# Patient Record
Sex: Male | Born: 1985 | Race: White | Hispanic: No | Marital: Married | Smoking: Never smoker
Health system: Southern US, Community
[De-identification: ages and names within clinical notes are randomized; demographics above are authoritative.]

## PROBLEM LIST (undated history)

## (undated) DIAGNOSIS — K219 Gastro-esophageal reflux disease without esophagitis: Secondary | ICD-10-CM

## (undated) DIAGNOSIS — G40909 Epilepsy, unspecified, not intractable, without status epilepticus: Secondary | ICD-10-CM

## (undated) HISTORY — PX: REFRACTIVE SURGERY: SHX103

## (undated) HISTORY — PX: OTHER SURGICAL HISTORY: SHX169

## (undated) HISTORY — DX: Gastro-esophageal reflux disease without esophagitis: K21.9

---

## 2012-12-19 ENCOUNTER — Emergency Department: Payer: Self-pay | Admitting: Emergency Medicine

## 2015-10-03 ENCOUNTER — Emergency Department
Admission: EM | Admit: 2015-10-03 | Discharge: 2015-10-04 | Disposition: A | Discharge status: Home / Self Care | Payer: Self-pay | Attending: Emergency Medicine | Admitting: Emergency Medicine

## 2015-10-03 ENCOUNTER — Encounter: Payer: Self-pay | Admitting: Emergency Medicine

## 2015-10-03 ENCOUNTER — Emergency Department: Payer: Self-pay

## 2015-10-03 DIAGNOSIS — R1030 Lower abdominal pain, unspecified: Secondary | ICD-10-CM

## 2015-10-03 DIAGNOSIS — N451 Epididymitis: Secondary | ICD-10-CM | POA: Insufficient documentation

## 2015-10-03 DIAGNOSIS — R1031 Right lower quadrant pain: Secondary | ICD-10-CM

## 2015-10-03 DIAGNOSIS — R110 Nausea: Secondary | ICD-10-CM | POA: Insufficient documentation

## 2015-10-03 NOTE — ED Notes (Signed)
Pt presents to ED with right sided groin pain that radiates into his testicle. Onset of groin pain was Friday. Yesterday seen at [facility] for the same and blood work and urine were negative per pt. No ultrasound performed at that time. Pt states today pain is now radiating into his right testicle. Pt reports pain makes it difficult for him to ambulate. Pt denies seeing any type of swelling to bulge to groin.

## 2015-10-04 ENCOUNTER — Emergency Department: Payer: Self-pay

## 2015-10-04 ENCOUNTER — Encounter: Payer: Self-pay | Admitting: Radiology

## 2015-10-04 LAB — CBC
HCT: 40.9 % (ref 40.0–52.0)
HEMOGLOBIN: 13.8 g/dL (ref 13.0–18.0)
MCH: 28.6 pg (ref 26.0–34.0)
MCHC: 33.7 g/dL (ref 32.0–36.0)
MCV: 85 fL (ref 80.0–100.0)
Platelets: 234 10*3/uL (ref 150–440)
RBC: 4.81 MIL/uL (ref 4.40–5.90)
RDW: 14.1 % (ref 11.5–14.5)
WBC: 11.7 10*3/uL — ABNORMAL HIGH (ref 3.8–10.6)

## 2015-10-04 LAB — COMPREHENSIVE METABOLIC PANEL
ALK PHOS: 75 U/L (ref 38–126)
ALT: 28 U/L (ref 17–63)
AST: 33 U/L (ref 15–41)
Albumin: 4.4 g/dL (ref 3.5–5.0)
Anion gap: 7 (ref 5–15)
BILIRUBIN TOTAL: 0.8 mg/dL (ref 0.3–1.2)
BUN: 16 mg/dL (ref 6–20)
CALCIUM: 8.9 mg/dL (ref 8.9–10.3)
CO2: 23 mmol/L (ref 22–32)
CREATININE: 0.8 mg/dL (ref 0.61–1.24)
Chloride: 108 mmol/L (ref 101–111)
Glucose, Bld: 110 mg/dL — ABNORMAL HIGH (ref 65–99)
Potassium: 3.4 mmol/L — ABNORMAL LOW (ref 3.5–5.1)
Sodium: 138 mmol/L (ref 135–145)
Total Protein: 7.7 g/dL (ref 6.5–8.1)

## 2015-10-04 LAB — URINALYSIS COMPLETE WITH MICROSCOPIC (ARMC ONLY)
BILIRUBIN URINE: NEGATIVE
Bacteria, UA: NONE SEEN
Glucose, UA: NEGATIVE mg/dL
Hgb urine dipstick: NEGATIVE
KETONES UR: NEGATIVE mg/dL
Leukocytes, UA: NEGATIVE
NITRITE: NEGATIVE
PROTEIN: NEGATIVE mg/dL
Specific Gravity, Urine: 1.03 (ref 1.005–1.030)
Squamous Epithelial / LPF: NONE SEEN
pH: 6 (ref 5.0–8.0)

## 2015-10-04 MED ORDER — CIPROFLOXACIN HCL 500 MG PO TABS
500.0000 mg | ORAL_TABLET | Freq: Once | ORAL | Status: AC
  Administered 2015-10-04: 500 mg via ORAL
  Filled 2015-10-04: qty 1

## 2015-10-04 MED ORDER — MORPHINE SULFATE (PF) 4 MG/ML IV SOLN
4.0000 mg | Freq: Once | INTRAVENOUS | Status: AC
  Administered 2015-10-04: 4 mg via INTRAVENOUS
  Filled 2015-10-04: qty 1

## 2015-10-04 MED ORDER — ONDANSETRON HCL 4 MG/2ML IJ SOLN
4.0000 mg | Freq: Once | INTRAMUSCULAR | Status: AC
  Administered 2015-10-04: 4 mg via INTRAVENOUS
  Filled 2015-10-04: qty 2

## 2015-10-04 MED ORDER — IOHEXOL 350 MG/ML SOLN
100.0000 mL | Freq: Once | INTRAVENOUS | Status: AC | PRN
  Administered 2015-10-04: 80 mL via INTRAVENOUS

## 2015-10-04 MED ORDER — KETOROLAC TROMETHAMINE 30 MG/ML IJ SOLN: 30.0000 mg | Freq: Once | INTRAMUSCULAR | Status: DC

## 2015-10-04 MED ORDER — KETOROLAC TROMETHAMINE 60 MG/2ML IM SOLN
INTRAMUSCULAR | Status: AC
  Administered 2015-10-04: 30 mg
  Filled 2015-10-04: qty 2

## 2015-10-04 MED ORDER — CEFTRIAXONE SODIUM 250 MG IJ SOLR
250.0000 mg | Freq: Once | INTRAMUSCULAR | Status: AC
  Administered 2015-10-04: 250 mg via INTRAMUSCULAR
  Filled 2015-10-04: qty 250

## 2015-10-04 MED ORDER — ETODOLAC 200 MG PO CAPS: 200.0000 mg | ORAL_CAPSULE | Freq: Three times a day (TID) | ORAL | Status: DC

## 2015-10-04 MED ORDER — DOXYCYCLINE HYCLATE 100 MG PO TABS: 100.0000 mg | ORAL_TABLET | Freq: Two times a day (BID) | ORAL | Status: DC

## 2015-10-04 MED ORDER — IOHEXOL 240 MG/ML SOLN
25.0000 mL | Freq: Once | INTRAMUSCULAR | Status: AC | PRN
  Administered 2015-10-04: 25 mL via ORAL

## 2015-10-04 MED ORDER — SODIUM CHLORIDE 0.9 % IV BOLUS (SEPSIS)
1000.0000 mL | Freq: Once | INTRAVENOUS | Status: AC
  Administered 2015-10-04: 1000 mL via INTRAVENOUS

## 2015-10-04 NOTE — ED Provider Notes (Signed)
[facility] Emergency Department Provider Note  ____________________________________________  Time seen: Approximately 00:50 AM  I have reviewed the triage vital signs and the nursing notes.   HISTORY  Chief Complaint Groin Pain and Testicle Pain    HPI Todd Roth is a 30 y.o. male who comes into the hospital today with some pain in his right groin. The patient reports that this pain started on Friday and has been getting worse. He reports that it is his right pelvis as well as his abdomen. He has chronic take Tylenol PM and ibuprofen but has not been helping. The patient has been nauseous and dry heaving. He reports that the pain is worse in his right lower quadrant and it goes into his groin. He said no fever and loss of appetite. He's never had pain like this in the past. He reports it is 8 out of 10 in intensity. The patient reports [facility] and has some urine and blood work done but everything was negative so they sent him home. The patient reports that he is unable to tolerate this pain at this time.   History reviewed. No pertinent past medical history.  There are no active problems to display for this patient.   Past Surgical History  Procedure Laterality Date  . Refractive surgery    . Eye surgery      Current Outpatient Rx  Name  Route  Sig  Dispense  Refill  . doxycycline (VIBRA-TABS) 100 MG tablet   Oral   Take 1 tablet (100 mg total) by mouth 2 (two) times daily.   14 tablet   0   . etodolac (LODINE) 200 MG capsule   Oral   Take 1 capsule (200 mg total) by mouth every 8 (eight) hours.   12 capsule   0     Allergies Metronidazole  No family history on file.  Social History Social History  Substance Use Topics  . Smoking status: Never Smoker   . Smokeless tobacco: Never Used  . Alcohol Use: No    Review of Systems Constitutional: No fever/chills Eyes: No visual changes. ENT: No sore  throat. Cardiovascular: Denies chest pain. Respiratory: Denies shortness of breath. Gastrointestinal:  abdominal pain. nausea, no vomiting.  No diarrhea.  No constipation. Genitourinary: Right testicle pain Musculoskeletal: Negative for back pain. Skin: Negative for rash. Neurological: Negative for headaches, focal weakness or numbness.  10-point ROS otherwise negative.  ____________________________________________   PHYSICAL EXAM:  VITAL SIGNS: ED Triage Vitals  Enc Vitals Group     BP 10/03/15 2241 145/102 mmHg     Pulse Rate 10/03/15 2241 85     Resp 10/03/15 2241 24     Temp 10/03/15 2241 98.1 F (36.7 C)     Temp Source 10/03/15 2241 Oral     SpO2 10/03/15 2241 98 %     Weight 10/03/15 2241 203 lb (92.08 kg)     Height 10/03/15 2241 5\' 7"  (1.702 m)     Head Cir --      Peak Flow --      Pain Score 10/03/15 2242 8     Pain Loc --      Pain Edu? --      Excl. in GC? --     Constitutional: Alert and oriented. Well appearing and in moderate distress. Eyes: Conjunctivae are normal. PERRL. EOMI. Head: Atraumatic. Nose: No congestion/rhinnorhea. Mouth/Throat: Mucous membranes are moist.  Oropharynx non-erythematous. Cardiovascular: Normal rate,  regular rhythm. Grossly normal heart sounds.  Good peripheral circulation. Respiratory: Normal respiratory effort.  No retractions. Lungs CTAB. Gastrointestinal: Soft right lower quadrant tenderness to palpation. No distention. Positive bowel sounds Genitourinary: Right testicular tenderness to palpation with no swelling or erythema Musculoskeletal: No lower extremity tenderness nor edema.   Neurologic:  Normal speech and language.  Skin:  Skin is warm, dry and intact.  Psychiatric: Mood and affect are normal.   ____________________________________________   LABS (all labs ordered are listed, but only abnormal results are displayed)  Labs Reviewed  CBC - Abnormal; Notable for the following:    WBC 11.7 (*)    All other  components within normal limits  COMPREHENSIVE METABOLIC PANEL - Abnormal; Notable for the following:    Potassium 3.4 (*)    Glucose, Bld 110 (*)    All other components within normal limits  URINALYSIS COMPLETEWITH MICROSCOPIC ([facility] ONLY) - Abnormal; Notable for the following:    Color, Urine YELLOW (*)    APPearance CLEAR (*)    All other components within normal limits   ____________________________________________  EKG  None ____________________________________________  RADIOLOGY  Scrotal ultrasound: Small right hydrocele, otherwise normal scrotal ultrasound  CT abdomen and pelvis: No acute or malady in the abdomen pelvis, particularly normal appendix ____________________________________________   PROCEDURES  Procedure(s) performed: None  Critical Care performed: No  ____________________________________________   INITIAL IMPRESSION / ASSESSMENT AND PLAN / ED COURSE  Pertinent labs & imaging results that were available during my care of the patient were reviewed by me and considered in my medical decision making (see chart for details).  This is a 30 year old male who comes into the hospital today with some right abdominal and right groin pain. The patient's CT ultrasound are both unremarkable. At this point I feel it is appropriate to treat the patient for epididymitis. He did receive 2 doses of morphine as well as some Zofran. I did give the patient a dose of ceftriaxone and ciprofloxacin but we'll change his discharge medication to doxycycline. The patient did receive a dose of Toradol as well and his pain is improved. He'll be discharged home to follow-up with the acute care clinic for his groin pain. ____________________________________________   FINAL CLINICAL IMPRESSION(S) / ED DIAGNOSES  Final diagnoses:  Groin pain, right  Epididymitis      Rebecka Apley, MD 10/04/15 [phone]

## 2015-10-04 NOTE — Discharge Instructions (Signed)
Epididymitis °Epididymitis is swelling (inflammation) of the epididymis. The epididymis is a cord-like structure that is located along the top and back part of the testicle. It collects and stores sperm from the testicle. °This condition can also cause pain and swelling of the testicle and scrotum. Symptoms usually start suddenly (acute epididymitis). Sometimes epididymitis starts gradually and lasts for a while (chronic epididymitis). This type may be harder to treat. °CAUSES °In men 35 and younger, this condition is usually caused by a bacterial infection or sexually transmitted disease (STD), such as: °· Gonorrhea. °· Chlamydia.   °In men 35 and older who do not have anal sex, this condition is usually caused by bacteria from a blockage or abnormalities in the urinary system. These can result from: °· Having a tube placed into the bladder (urinary catheter). °· Having an enlarged or inflamed prostate gland. °· Having recent urinary tract surgery. °In men who have a condition that weakens the body's defense system (immune system), such as HIV, this condition can be caused by:  °· Other bacteria, including tuberculosis and syphilis. °· Viruses. °· Fungi. °Sometimes this condition occurs without infection. That may happen if urine flows backward into the epididymis after heavy lifting or straining. °RISK FACTORS °This condition is more likely to develop in men: °· Who have unprotected sex with more than one partner. °· Who have anal sex.   °· Who have recently had surgery.   °· Who have a urinary catheter. °· Who have urinary problems. °· Who have a suppressed immune system.   °SYMPTOMS  °This condition usually begins suddenly with chills, fever, and pain behind the scrotum and in the testicle. Other symptoms include:  °· Swelling of the scrotum, testicle, or both. °· Pain when ejaculating or urinating. °· Pain in the back or belly. °· Nausea. °· Itching and discharge from the penis. °· Frequent need to pass  urine. °· Redness and tenderness of the scrotum. °DIAGNOSIS °Your health care provider can diagnose this condition based on your symptoms and medical history. Your health care provider will also do a physical exam to ask about your symptoms and check your scrotum and testicle for swelling, pain, and redness. You may also have other tests, including:   °· Examination of discharge from the penis. °· Urine tests for infections, such as STDs.   °Your health care provider may test you for other STDs, including HIV.  °TREATMENT °Treatment for this condition depends on the cause. If your condition is caused by a bacterial infection, oral antibiotic medicine may be prescribed. If the bacterial infection has spread to your blood, you may need to receive IV antibiotics. Nonbacterial epididymitis is treated with home care that includes bed rest and elevation of the scrotum. °Surgery may be needed to treat: °· Bacterial epididymitis that causes pus to build up in the scrotum (abscess). °· Chronic epididymitis that has not responded to other treatments. °HOME CARE INSTRUCTIONS °Medicines  °· Take over-the-counter and prescription medicines only as told by your health care provider.   °· If you were prescribed an antibiotic medicine, take it as told by your health care provider. Do not stop taking the antibiotic even if your condition improves. °Sexual Activity  °· If your epididymitis was caused by an STD, avoid sexual activity until your treatment is complete. °· Inform your sexual partner or partners if you test positive for an STD. They may need to be treated. Do not engage in sexual activity with your partner or partners until their treatment is completed. °General Instructions  °· Return to your normal activities as told   by your health care provider. Ask your health care provider what activities are safe for you.  Keep your scrotum elevated and supported while resting. Ask your health care provider if you should wear a  scrotal support, such as a jockstrap. Wear it as told by your health care provider.  If directed, apply ice to the affected area:   Put ice in a plastic bag.  Place a towel between your skin and the bag.  Leave the ice on for 20 minutes, 2-3 times per day.  Try taking a sitz bath to help with discomfort. This is a warm water bath that is taken while you are sitting down. The water should only come up to your hips and should cover your buttocks. Do this 3-4 times per day or as told by your health care provider.  Keep all follow-up visits as told by your health care provider. This is important. SEEK MEDICAL CARE IF:   You have a fever.   Your pain medicine is not helping.   Your pain is getting worse.   Your symptoms do not improve within three days.   This information is not intended to replace advice given to you by your health care provider. Make sure you discuss any questions you have with your health care provider.   Document Released: 07/13/2000 Document Revised: 04/06/2015 Document Reviewed: 12/01/2014 Elsevier Interactive Patient Education 2016 [employer] Inc[employer]  Abdominal Pain, Adult Many things can cause abdominal pain. Usually, abdominal pain is not caused by a disease and will improve without treatment. It can often be observed and treated at home. Your health care provider will do a physical exam and possibly order blood tests and X-rays to help determine the seriousness of your pain. However, in many cases, more time must pass before a clear cause of the pain can be found. Before that point, your health care provider may not know if you need more testing or further treatment. HOME CARE INSTRUCTIONS Monitor your abdominal pain for any changes. The following actions may help to alleviate any discomfort you are experiencing:  Only take over-the-counter or prescription medicines as directed by your health care provider.  Do not take laxatives unless directed to do so by  your health care provider.  Try a clear liquid diet (broth, tea, or water) as directed by your health care provider. Slowly move to a bland diet as tolerated. SEEK MEDICAL CARE IF:  You have unexplained abdominal pain.  You have abdominal pain associated with nausea or diarrhea.  You have pain when you urinate or have a bowel movement.  You experience abdominal pain that wakes you in the night.  You have abdominal pain that is worsened or improved by eating food.  You have abdominal pain that is worsened with eating fatty foods.  You have a fever. SEEK IMMEDIATE MEDICAL CARE IF:  Your pain does not go away within 2 hours.  You keep throwing up (vomiting).  Your pain is felt only in portions of the abdomen, such as the right side or the left lower portion of the abdomen.  You pass bloody or black tarry stools. MAKE SURE YOU:  Understand these instructions.  Will watch your condition.  Will get help right away if you are not doing well or get worse.   This information is not intended to replace advice given to you by your health care provider. Make sure you discuss any questions you have with your health care provider.   Document Released: 04/25/2005  Document Revised: 04/06/2015 Document Reviewed: 03/25/2013 Elsevier Interactive Patient Education [employer]

## 2016-08-27 ENCOUNTER — Ambulatory Visit
Admission: EM | Admit: 2016-08-27 | Discharge: 2016-08-27 | Discharge status: Absent without leave | Payer: Medicaid Other

## 2016-10-08 IMAGING — CT CT ABD-PELV W/ CM
1 of 2 series · 15 of 32 positions shown, 19 images · IV contrast (omnipaque)
Comparison: None.

CLINICAL DATA: Right lower quadrant/groin pain for 3 days.

EXAM:
CT ABDOMEN AND PELVIS WITH CONTRAST
TECHNIQUE: Multidetector CT imaging of the abdomen and pelvis was performed
using the standard protocol following bolus administration of
intravenous contrast.
CONTRAST:  25mL OMNIPAQUE IOHEXOL 240 MG/ML SOLN, 80mL OMNIPAQUE
IOHEXOL 350 MG/ML SOLN

[Series 2: routine abd pel with · axial · 0.71mm/px · z∈[-492,-28]mm · 15 of 103 slices shown, 19 images]
[im 5/103  soft-tissue]
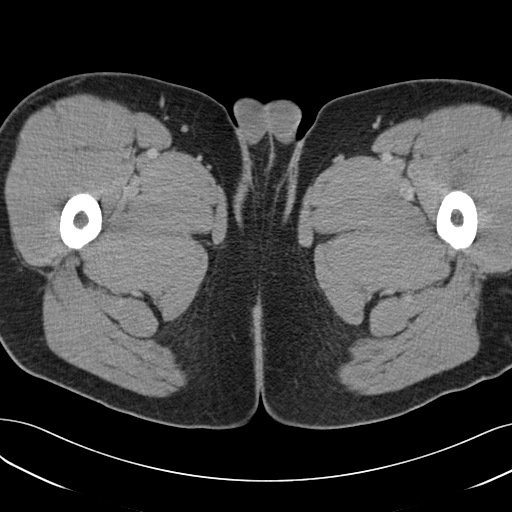
[im 5/103  bone]
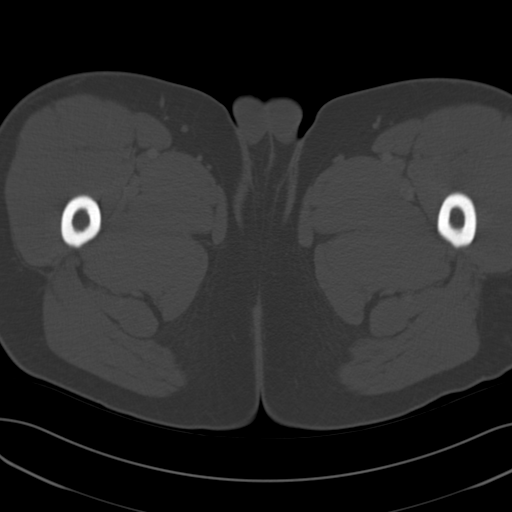
[im 14/103  soft-tissue]
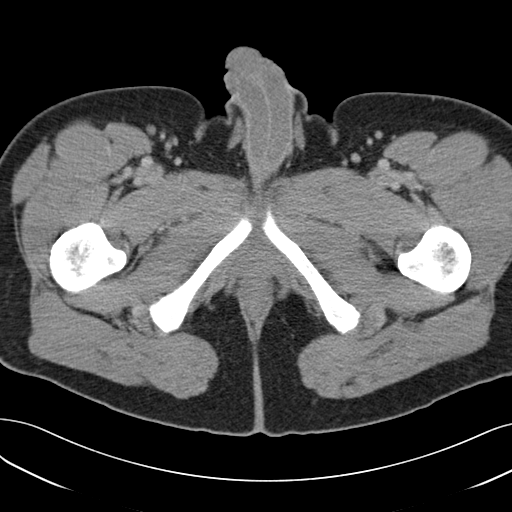
[im 23/103  soft-tissue]
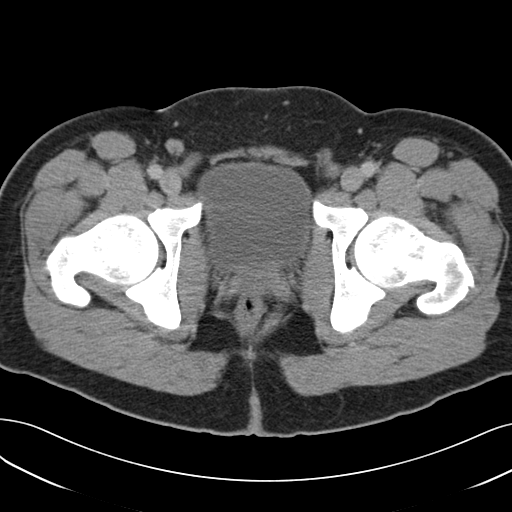
[im 27/103  soft-tissue]
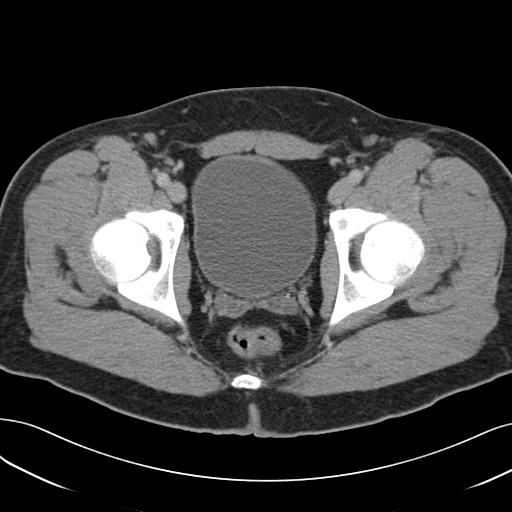
[im 36/103  soft-tissue]
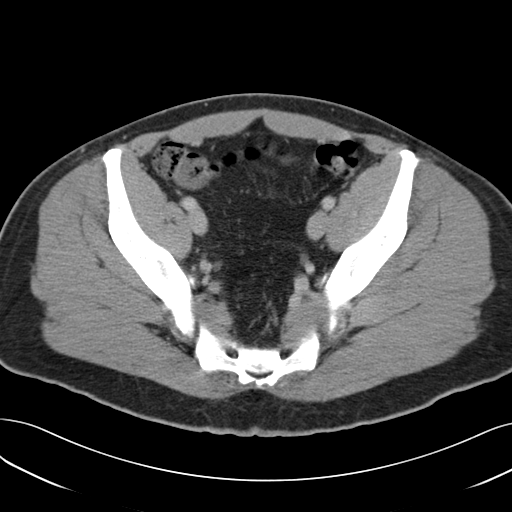
[im 45/103  soft-tissue]
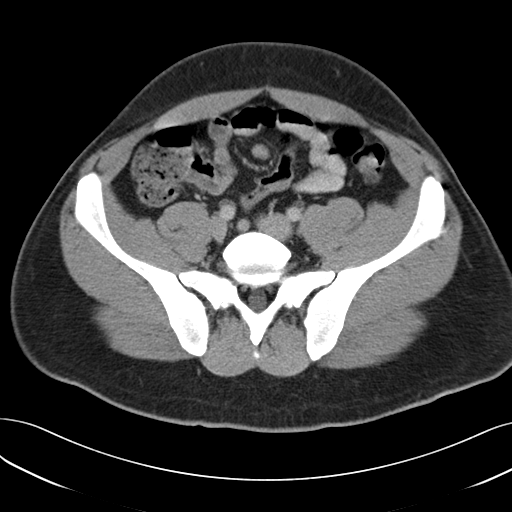
[im 54/103  soft-tissue]
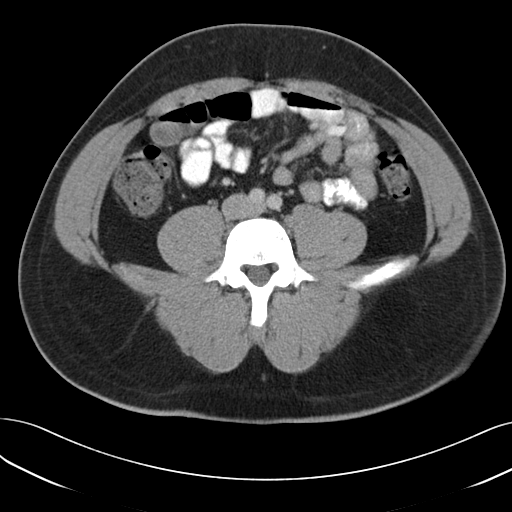
[im 58/103  soft-tissue]
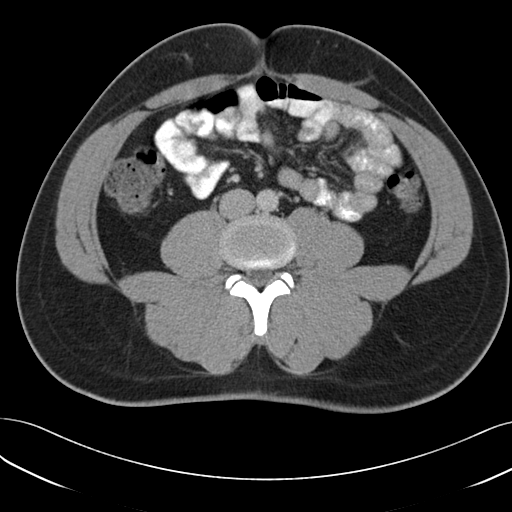
[im 67/103  soft-tissue]
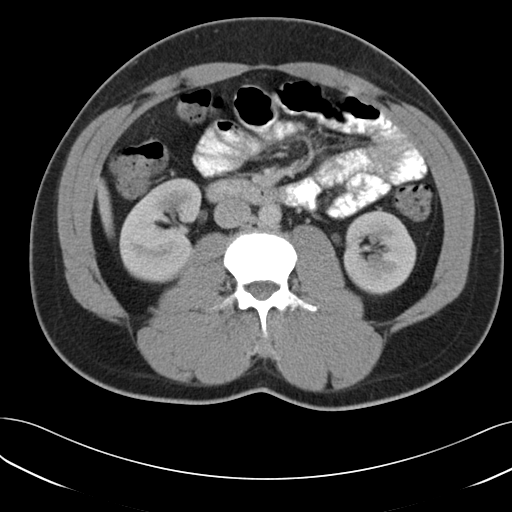
[im 67/103  bone]
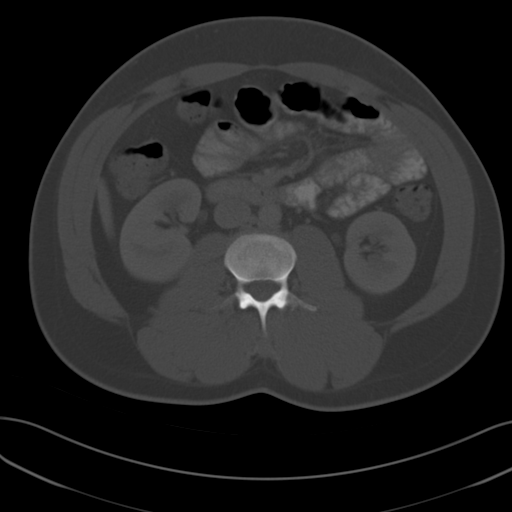
[im 76/103  soft-tissue]
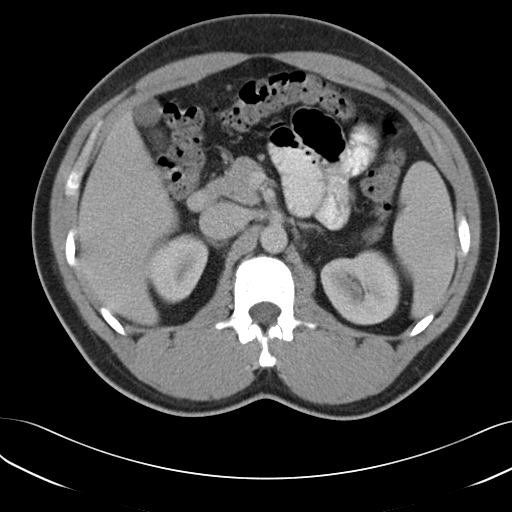
[im 80/103  soft-tissue]
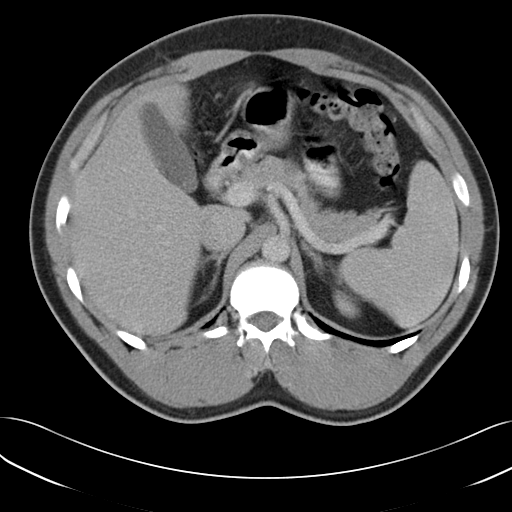
[im 85/103  lung]
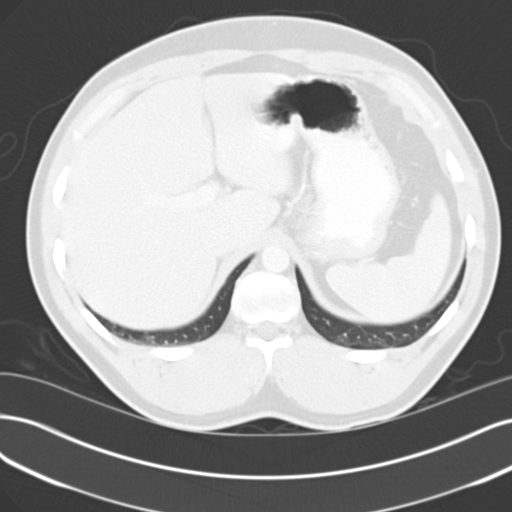
[im 89/103  soft-tissue]
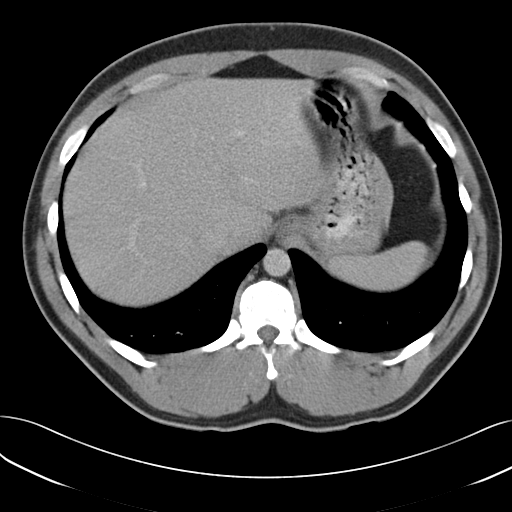
[im 89/103  lung]
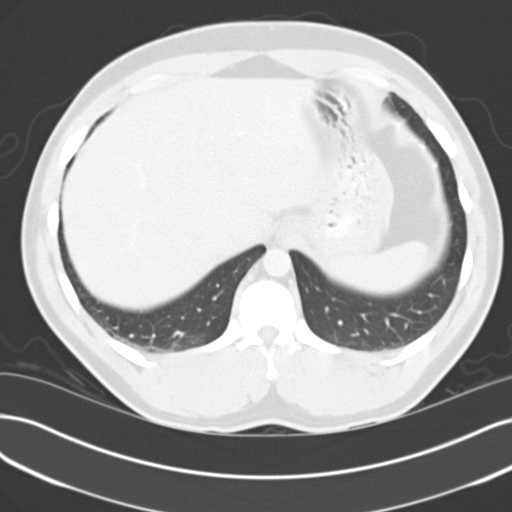
[im 94/103  lung]
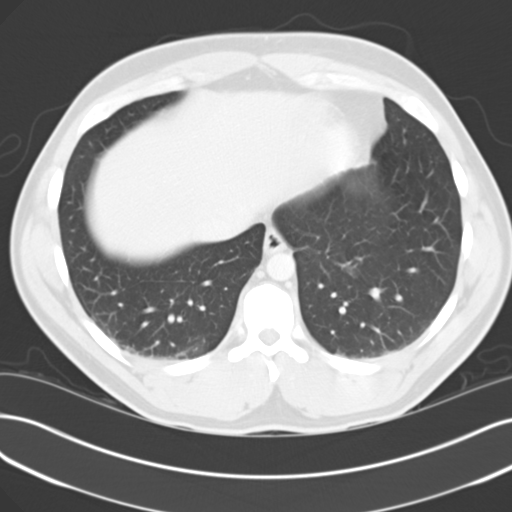
[im 98/103  soft-tissue]
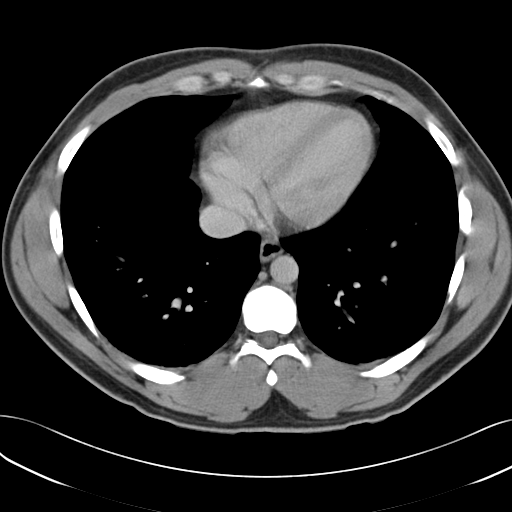
[im 98/103  lung]
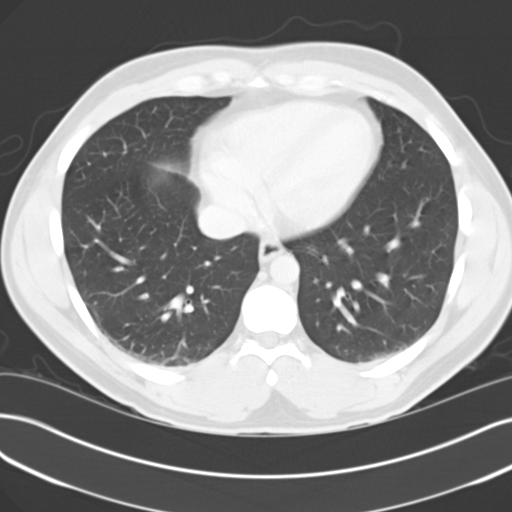

[15 of 32 positions shown; findings below may reference images not displayed]

FINDINGS: Lower chest:  The included lung bases are clear.

Liver: No focal lesion.

Hepatobiliary: Gallbladder physiologically distended, no calcified
stone. No biliary dilatation.

Pancreas: No ductal dilatation or inflammation.

Spleen: Normal.

Adrenal glands: No nodule.

Kidneys: Symmetric renal enhancement. No hydronephrosis. No
perinephric stranding or focal abnormality.

Stomach/Bowel: Stomach physiologically distended. There are no
dilated or thickened small bowel loops. Moderate volume of stool
throughout the colon without colonic wall thickening. The appendix
is normal, coursing in the central pelvis.

Vascular/Lymphatic: No retroperitoneal adenopathy. Abdominal aorta
is normal in caliber.

Reproductive: Prostate gland normal in size.

Bladder: Physiologically distended, no wall thickening or stone.

Other: No free air, free fluid, or intra-abdominal fluid collection.
No inguinal hernia.

Musculoskeletal: There are no acute or suspicious osseous
abnormalities.
IMPRESSION: No acute abnormality in the abdomen/pelvis. Particularly, normal
appendix.

## 2022-08-22 ENCOUNTER — Encounter (HOSPITAL_BASED_OUTPATIENT_CLINIC_OR_DEPARTMENT_OTHER): Payer: Self-pay

## 2022-08-22 ENCOUNTER — Emergency Department (HOSPITAL_BASED_OUTPATIENT_CLINIC_OR_DEPARTMENT_OTHER)
Admission: EM | Admit: 2022-08-22 | Discharge: 2022-08-22 | Disposition: A | Discharge status: Home / Self Care | Payer: Non-veteran care | Attending: Emergency Medicine | Admitting: Emergency Medicine

## 2022-08-22 ENCOUNTER — Other Ambulatory Visit: Payer: Self-pay

## 2022-08-22 DIAGNOSIS — M545 Low back pain, unspecified: Secondary | ICD-10-CM

## 2022-08-22 DIAGNOSIS — G8911 Acute pain due to trauma: Secondary | ICD-10-CM | POA: Insufficient documentation

## 2022-08-22 DIAGNOSIS — M62830 Muscle spasm of back: Secondary | ICD-10-CM | POA: Insufficient documentation

## 2022-08-22 DIAGNOSIS — R1032 Left lower quadrant pain: Secondary | ICD-10-CM | POA: Diagnosis not present

## 2022-08-22 DIAGNOSIS — Y92410 Unspecified street and highway as the place of occurrence of the external cause: Secondary | ICD-10-CM | POA: Diagnosis not present

## 2022-08-22 DIAGNOSIS — M25552 Pain in left hip: Secondary | ICD-10-CM | POA: Insufficient documentation

## 2022-08-22 MED ORDER — CYCLOBENZAPRINE HCL 10 MG PO TABS: 10.0000 mg | ORAL_TABLET | Freq: Two times a day (BID) | ORAL | 0 refills | Status: DC | PRN

## 2022-08-22 MED ORDER — MELOXICAM 7.5 MG PO TABS: 7.5000 mg | ORAL_TABLET | Freq: Every day | ORAL | 0 refills | Status: DC
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
## 2022-08-22 NOTE — ED Notes (Signed)
Pt verbalized understanding of d/c instructions, meds, and followup care. Denies questions. VSS, no distress noted. Steady gait to exit with all belongings.  ?
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
## 2022-08-22 NOTE — Discharge Instructions (Addendum)
Thank you for allowing me to be part of your care today.  I suspect your pain is musculoskeletal and you are also having muscle spasms.  I have sent over a daily NSAID and muscle relaxer to your pharmacy to help.  Continue to take your Lyrica as prescribed.   [facility]  Return to the ED if you have worsening of your symptoms, are unable to walk, lose control of your bladder or bowels or have urinary retention.
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
## 2022-11-18 ENCOUNTER — Emergency Department (HOSPITAL_COMMUNITY)
Admission: EM | Admit: 2022-11-18 | Discharge: 2022-11-18 | Disposition: A | Discharge status: Home / Self Care | Payer: No Typology Code available for payment source | Attending: Emergency Medicine | Admitting: Emergency Medicine

## 2022-11-18 ENCOUNTER — Emergency Department (HOSPITAL_COMMUNITY): Payer: No Typology Code available for payment source

## 2022-11-18 ENCOUNTER — Other Ambulatory Visit: Payer: Self-pay

## 2022-11-18 DIAGNOSIS — R4189 Other symptoms and signs involving cognitive functions and awareness: Secondary | ICD-10-CM | POA: Insufficient documentation

## 2022-11-18 DIAGNOSIS — R5383 Other fatigue: Secondary | ICD-10-CM | POA: Diagnosis present

## 2022-11-18 LAB — I-STAT CHEM 8, ED
BUN: 10 mg/dL (ref 6–20)
Calcium, Ion: 1.12 mmol/L — ABNORMAL LOW (ref 1.15–1.40)
Chloride: 104 mmol/L (ref 98–111)
Creatinine, Ser: 0.9 mg/dL (ref 0.61–1.24)
Glucose, Bld: 99 mg/dL (ref 70–99)
HCT: 38 % — ABNORMAL LOW (ref 39.0–52.0)
Hemoglobin: 12.9 g/dL — ABNORMAL LOW (ref 13.0–17.0)
Potassium: 3.6 mmol/L (ref 3.5–5.1)
Sodium: 140 mmol/L (ref 135–145)
TCO2: 25 mmol/L (ref 22–32)

## 2022-11-18 LAB — COMPREHENSIVE METABOLIC PANEL
ALT: 33 U/L (ref 0–44)
AST: 40 U/L (ref 15–41)
Albumin: 4 g/dL (ref 3.5–5.0)
Alkaline Phosphatase: 69 U/L (ref 38–126)
Anion gap: 12 (ref 5–15)
BUN: 9 mg/dL (ref 6–20)
CO2: 25 mmol/L (ref 22–32)
Calcium: 9.1 mg/dL (ref 8.9–10.3)
Chloride: 102 mmol/L (ref 98–111)
Creatinine, Ser: 1.11 mg/dL (ref 0.61–1.24)
GFR, Estimated: >60 mL/min (ref >60)
Glucose, Bld: 99 mg/dL (ref 70–99)
Potassium: 3.2 mmol/L — ABNORMAL LOW (ref 3.5–5.1)
Sodium: 139 mmol/L (ref 135–145)
Total Bilirubin: 0.8 mg/dL (ref 0.3–1.2)
Total Protein: 7.1 g/dL (ref 6.5–8.1)

## 2022-11-18 LAB — CBC WITH DIFFERENTIAL/PLATELET
Abs Immature Granulocytes: 0.01 10*3/uL (ref 0.00–0.07)
Basophils Absolute: 0 10*3/uL (ref 0.0–0.1)
Basophils Relative: 0 %
Eosinophils Absolute: 0.1 10*3/uL (ref 0.0–0.5)
Eosinophils Relative: 1 %
HCT: 37 % — ABNORMAL LOW (ref 39.0–52.0)
Hemoglobin: 12.1 g/dL — ABNORMAL LOW (ref 13.0–17.0)
Immature Granulocytes: 0 %
Lymphocytes Relative: 34 %
Lymphs Abs: 2.7 10*3/uL (ref 0.7–4.0)
MCH: 28.1 pg (ref 26.0–34.0)
MCHC: 32.7 g/dL (ref 30.0–36.0)
MCV: 86 fL (ref 80.0–100.0)
Monocytes Absolute: 0.6 10*3/uL (ref 0.1–1.0)
Monocytes Relative: 8 %
Neutro Abs: 4.5 10*3/uL (ref 1.7–7.7)
Neutrophils Relative %: 57 %
Platelets: 173 10*3/uL (ref 150–400)
RBC: 4.3 MIL/uL (ref 4.22–5.81)
RDW: 14.2 % (ref 11.5–15.5)
WBC: 7.9 10*3/uL (ref 4.0–10.5)
nRBC: 0 % (ref 0.0–0.2)

## 2022-11-18 LAB — I-STAT VENOUS BLOOD GAS, ED
Acid-Base Excess: 1 mmol/L (ref 0.0–2.0)
Bicarbonate: 24.9 mmol/L (ref 20.0–28.0)
Calcium, Ion: 1.11 mmol/L — ABNORMAL LOW (ref 1.15–1.40)
HCT: 39 % (ref 39.0–52.0)
Hemoglobin: 13.3 g/dL (ref 13.0–17.0)
O2 Saturation: 83 %
Potassium: 3.6 mmol/L (ref 3.5–5.1)
Sodium: 140 mmol/L (ref 135–145)
TCO2: 26 mmol/L (ref 22–32)
pCO2, Ven: 37.2 mmHg — ABNORMAL LOW (ref 44–60)
pH, Ven: 7.434 — ABNORMAL HIGH (ref 7.25–7.43)
pO2, Ven: 46 mmHg — ABNORMAL HIGH (ref 32–45)

## 2022-11-18 LAB — ETHANOL: Alcohol, Ethyl (B): <10 mg/dL (ref <10)

## 2022-11-18 LAB — TROPONIN I (HIGH SENSITIVITY): Troponin I (High Sensitivity): 4 ng/L (ref <18)

## 2022-11-18 LAB — SALICYLATE LEVEL: Salicylate Lvl: <7 mg/dL — ABNORMAL LOW (ref 7.0–30.0)

## 2022-11-18 LAB — ACETAMINOPHEN LEVEL: Acetaminophen (Tylenol), Serum: <10 ug/mL — ABNORMAL LOW (ref 10–30)

## 2022-11-18 MED ORDER — AMMONIA AROMATIC IN INHA
RESPIRATORY_TRACT | Status: AC
  Filled 2022-11-18: qty 10

## 2022-11-18 MED ORDER — IOHEXOL 350 MG/ML SOLN
75.0000 mL | Freq: Once | INTRAVENOUS | Status: AC | PRN
  Administered 2022-11-18: 75 mL via INTRAVENOUS

## 2022-11-18 NOTE — ED Notes (Signed)
Patient transported to MRI 

## 2022-11-18 NOTE — ED Notes (Signed)
Patient transported to CT scan . 

## 2022-11-18 NOTE — ED Provider Notes (Signed)
[facility] Emergency Department Provider Note MRN:  [id number]  Arrival date & time: 11/18/22     Chief Complaint   'Feels Tired " / Fatigue   History of Present Illness   Todd Roth is a 37 y.o. year-old male with a history of TBI presenting to the ED with chief complaint of feels tired.  Patient arrived with complaint of feeling tired.  Found in the waiting room not waking up, unresponsive.  Review of Systems  A thorough review of systems was obtained and all systems are negative except as noted in the HPI and PMH.   Patient's Health History   No past medical history on file.  Past Surgical History:  Procedure Laterality Date   eye surgery     REFRACTIVE SURGERY      No family history on file.  Social History   Socioeconomic History   Marital status: Married    Spouse name: Not on file   Number of children: Not on file   Years of education: Not on file   Highest education level: Not on file  Occupational History   Not on file  Tobacco Use   Smoking status: Never   Smokeless tobacco: Never  Substance and Sexual Activity   Alcohol use: No   Drug use: No   Sexual activity: Not on file  Other Topics Concern   Not on file  Social History Narrative   Not on file   Social Determinants of Health   Financial Resource Strain: Not on file  Food Insecurity: Not on file  Transportation Needs: Not on file  Physical Activity: Not on file  Stress: Not on file  Social Connections: Not on file  Intimate Partner Violence: Not on file     Physical Exam   Vitals:   11/18/22 0500 11/18/22 0608  BP: 113/75 118/84  Pulse: (!) 59 69  Resp: 12 18  Temp:    SpO2: 93% 100%    CONSTITUTIONAL: Well-appearing, NAD NEURO/PSYCH: Withdrawals meaningfully from noxious stimuli, moves all extremities EYES:  eyes equal and reactive, pupils dilated ENT/NECK:  no LAD, no JVD CARDIO: Regular rate, well-perfused, normal S1 and S2 PULM:  CTAB no  wheezing or rhonchi GI/GU:  non-distended, non-tender MSK/SPINE:  No gross deformities, no edema SKIN:  no rash, atraumatic   *Additional and/or pertinent findings included in MDM below  Diagnostic and Interventional Summary    EKG Interpretation  Date/Time:  Sunday November 18 2022 03:09:34 EDT Ventricular Rate:  72 PR Interval:  183 QRS Duration: 88 QT Interval:  403 QTC Calculation: 441 R Axis:   -3 Text Interpretation: Sinus rhythm Left ventricular hypertrophy Early repolarization Confirmed by Kennis Carina [phone]) on 11/18/2022 3:52:52 AM       Labs Reviewed  CBC WITH DIFFERENTIAL/PLATELET - Abnormal; Notable for the following components:      Result Value   Hemoglobin 12.1 (*)    HCT 37.0 (*)    All other components within normal limits  COMPREHENSIVE METABOLIC PANEL - Abnormal; Notable for the following components:   Potassium 3.2 (*)    All other components within normal limits  ACETAMINOPHEN LEVEL - Abnormal; Notable for the following components:   Acetaminophen (Tylenol), Serum <10 (*)    All other components within normal limits  SALICYLATE LEVEL - Abnormal; Notable for the following components:   Salicylate Lvl <7.0 (*)    All other components within normal limits  I-STAT CHEM 8, ED - Abnormal; Notable for the  following components:   Calcium, Ion 1.12 (*)    Hemoglobin 12.9 (*)    HCT 38.0 (*)    All other components within normal limits  I-STAT VENOUS BLOOD GAS, ED - Abnormal; Notable for the following components:   pH, Ven 7.434 (*)    pCO2, Ven 37.2 (*)    pO2, Ven 46 (*)    Calcium, Ion 1.11 (*)    All other components within normal limits  ETHANOL  RAPID URINE DRUG SCREEN, HOSP PERFORMED  TROPONIN I (HIGH SENSITIVITY)    MR BRAIN WO CONTRAST  Final Result    CT ANGIO HEAD NECK W WO CM  Final Result    CT HEAD WO CONTRAST ( )  Final Result      Medications  ammonia inhalant (has no administration in time range)  iohexol (OMNIPAQUE) 350  MG/ML injection 75 mL (75 mLs Intravenous Contrast Given 11/18/22 0530)     Procedures  /  Critical Care Procedures  ED Course and Medical Decision Making  Initial Impression and Ddx Patient does not seem to be truly unresponsive.  When I attempt to open his eyelids there is resistance.  When I hold them open and perform a brisk movement toward his eyes he has blink to threat.  He withdrawals swiftly from pain in all extremities.  He vigorously moves away from ammonia at the nostrils.  Despite all this he will not wake up and talk to me.  Per wife he is had a lot of stress recently.  Apart from some type of functional process other considerations include intracranial bleeding, intracranial mass, electrolyte disturbance, awaiting labs, CT head.  Past medical/surgical history that increases complexity of ED encounter: Schizoaffective, TBI  Interpretation of Diagnostics I personally reviewed the EKG and my interpretation is as follows: Sinus rhythm, benign early repolarization  Labs reassuring with no significant blood count or electrolyte disturbance, troponin negative  Patient Reassessment and Ultimate Disposition/Management     Case discussed with Dr. Wilford Corner of neurology, recommending further imaging to exclude basilar infarct however seems more functional.  CTA and MRI are normal as is the CT Noncon of the head.  On my repeat assessment with additional usage of ammonia patient wakes and forms some words.  No emergent process, appropriate for discharge.  Patient management required discussion with the following services or consulting groups:  Neurology  Complexity of Problems Addressed Acute illness or injury that poses threat of life of bodily function  Additional Data Reviewed and Analyzed Further history obtained from: Further history from spouse/family member  Additional Factors Impacting ED Encounter Risk Consideration of hospitalization  Elmer Sow. Pilar Plate, MD [facility]  Emergency Medicine [facility] [email or web address]  Final Clinical Impressions(s) / ED Diagnoses     ICD-10-CM   1. Unresponsiveness  R41.89       ED Discharge Orders          Ordered    Ambulatory referral to Neurology       Comments: An appointment is requested in approximately: 2 weeks   11/18/22 0630             Discharge Instructions Discussed with and Provided to Patient:     Discharge Instructions      You were evaluated in the Emergency Department and after careful evaluation, we did not find any emergent condition requiring admission or further testing in the hospital.  Your exam/testing today was overall reassuring.  Recommend follow-up with neurology as well as behavioral health  for further management.  Please return to the Emergency Department if you experience any worsening of your condition.  Thank you for allowing [place] to be a part of your care.        Sabas Sous, MD 11/18/22 [phone]

## 2022-11-18 NOTE — Discharge Instructions (Signed)
You were evaluated in the Emergency Department and after careful evaluation, we did not find any emergent condition requiring admission or further testing in the hospital.  Your exam/testing today was overall reassuring.  Recommend follow-up with neurology as well as behavioral health for further management.  Please return to the Emergency Department if you experience any worsening of your condition.  Thank you for allowing [place] to be a part of your care.

## 2022-11-18 NOTE — ED Triage Notes (Signed)
Patient arrived with EMS [place] , patient reports " feels tired " / fatigue today , CBG= 93 , respirations unlabored . Somnolent at arrival .

## 2022-11-19 ENCOUNTER — Inpatient Hospital Stay (HOSPITAL_BASED_OUTPATIENT_CLINIC_OR_DEPARTMENT_OTHER)
Admission: EM | Admit: 2022-11-19 | Discharge: 2022-11-23 | DRG: 091 | Disposition: A | Discharge status: Home / Self Care | Payer: No Typology Code available for payment source | Attending: Internal Medicine | Admitting: Internal Medicine

## 2022-11-19 ENCOUNTER — Encounter (HOSPITAL_BASED_OUTPATIENT_CLINIC_OR_DEPARTMENT_OTHER): Payer: Self-pay

## 2022-11-19 ENCOUNTER — Other Ambulatory Visit: Payer: Self-pay

## 2022-11-19 ENCOUNTER — Emergency Department (HOSPITAL_BASED_OUTPATIENT_CLINIC_OR_DEPARTMENT_OTHER): Payer: No Typology Code available for payment source

## 2022-11-19 DIAGNOSIS — Z888 Allergy status to other drugs, medicaments and biological substances status: Secondary | ICD-10-CM

## 2022-11-19 DIAGNOSIS — F251 Schizoaffective disorder, depressive type: Secondary | ICD-10-CM | POA: Diagnosis present

## 2022-11-19 DIAGNOSIS — Z79899 Other long term (current) drug therapy: Secondary | ICD-10-CM

## 2022-11-19 DIAGNOSIS — G928 Other toxic encephalopathy: Secondary | ICD-10-CM | POA: Diagnosis not present

## 2022-11-19 DIAGNOSIS — R7989 Other specified abnormal findings of blood chemistry: Secondary | ICD-10-CM

## 2022-11-19 DIAGNOSIS — R400 Somnolence: Secondary | ICD-10-CM

## 2022-11-19 DIAGNOSIS — T426X5A Adverse effect of other antiepileptic and sedative-hypnotic drugs, initial encounter: Secondary | ICD-10-CM | POA: Diagnosis present

## 2022-11-19 DIAGNOSIS — R4182 Altered mental status, unspecified: Secondary | ICD-10-CM | POA: Diagnosis not present

## 2022-11-19 DIAGNOSIS — E7220 Disorder of urea cycle metabolism, unspecified: Secondary | ICD-10-CM | POA: Diagnosis present

## 2022-11-19 DIAGNOSIS — G8929 Other chronic pain: Secondary | ICD-10-CM | POA: Diagnosis present

## 2022-11-19 DIAGNOSIS — Z87820 Personal history of traumatic brain injury: Secondary | ICD-10-CM

## 2022-11-19 DIAGNOSIS — Z791 Long term (current) use of non-steroidal anti-inflammatories (NSAID): Secondary | ICD-10-CM

## 2022-11-19 DIAGNOSIS — R5381 Other malaise: Secondary | ICD-10-CM

## 2022-11-19 DIAGNOSIS — E872 Acidosis, unspecified: Secondary | ICD-10-CM | POA: Diagnosis present

## 2022-11-19 DIAGNOSIS — K219 Gastro-esophageal reflux disease without esophagitis: Secondary | ICD-10-CM | POA: Diagnosis present

## 2022-11-19 DIAGNOSIS — E669 Obesity, unspecified: Secondary | ICD-10-CM | POA: Diagnosis present

## 2022-11-19 DIAGNOSIS — Z6831 Body mass index (BMI) 31.0-31.9, adult: Secondary | ICD-10-CM

## 2022-11-19 DIAGNOSIS — G9340 Encephalopathy, unspecified: Secondary | ICD-10-CM | POA: Diagnosis present

## 2022-11-19 DIAGNOSIS — G9341 Metabolic encephalopathy: Secondary | ICD-10-CM | POA: Diagnosis present

## 2022-11-19 HISTORY — DX: Epilepsy, unspecified, not intractable, without status epilepticus: G40.909

## 2022-11-19 LAB — COMPREHENSIVE METABOLIC PANEL
ALT: 25 U/L (ref 0–44)
AST: 25 U/L (ref 15–41)
Albumin: 4.4 g/dL (ref 3.5–5.0)
Alkaline Phosphatase: 71 U/L (ref 38–126)
Anion gap: 10 (ref 5–15)
BUN: 13 mg/dL (ref 6–20)
CO2: 26 mmol/L (ref 22–32)
Calcium: 9.4 mg/dL (ref 8.9–10.3)
Chloride: 104 mmol/L (ref 98–111)
Creatinine, Ser: 1.02 mg/dL (ref 0.61–1.24)
GFR, Estimated: >60 mL/min (ref >60)
Glucose, Bld: 92 mg/dL (ref 70–99)
Potassium: 3.6 mmol/L (ref 3.5–5.1)
Sodium: 140 mmol/L (ref 135–145)
Total Bilirubin: 0.6 mg/dL (ref 0.3–1.2)
Total Protein: 7.6 g/dL (ref 6.5–8.1)

## 2022-11-19 LAB — CBC WITH DIFFERENTIAL/PLATELET
Abs Immature Granulocytes: 0.01 10*3/uL (ref 0.00–0.07)
Basophils Absolute: 0 10*3/uL (ref 0.0–0.1)
Basophils Relative: 0 %
Eosinophils Absolute: 0.1 10*3/uL (ref 0.0–0.5)
Eosinophils Relative: 2 %
HCT: 43.1 % (ref 39.0–52.0)
Hemoglobin: 14 g/dL (ref 13.0–17.0)
Immature Granulocytes: 0 %
Lymphocytes Relative: 33 %
Lymphs Abs: 2.1 10*3/uL (ref 0.7–4.0)
MCH: 28.3 pg (ref 26.0–34.0)
MCHC: 32.5 g/dL (ref 30.0–36.0)
MCV: 87.1 fL (ref 80.0–100.0)
Monocytes Absolute: 0.4 10*3/uL (ref 0.1–1.0)
Monocytes Relative: 7 %
Neutro Abs: 3.8 10*3/uL (ref 1.7–7.7)
Neutrophils Relative %: 58 %
Platelets: 191 10*3/uL (ref 150–400)
RBC: 4.95 MIL/uL (ref 4.22–5.81)
RDW: 14.6 % (ref 11.5–15.5)
WBC: 6.5 10*3/uL (ref 4.0–10.5)
nRBC: 0 % (ref 0.0–0.2)

## 2022-11-19 LAB — I-STAT VENOUS BLOOD GAS, ED
Acid-Base Excess: 1 mmol/L (ref 0.0–2.0)
Bicarbonate: 26.2 mmol/L (ref 20.0–28.0)
Calcium, Ion: 1.22 mmol/L (ref 1.15–1.40)
HCT: 38 % — ABNORMAL LOW (ref 39.0–52.0)
Hemoglobin: 12.9 g/dL — ABNORMAL LOW (ref 13.0–17.0)
O2 Saturation: 84 %
Potassium: 3.6 mmol/L (ref 3.5–5.1)
Sodium: 139 mmol/L (ref 135–145)
TCO2: 27 mmol/L (ref 22–32)
pCO2, Ven: 41.9 mmHg — ABNORMAL LOW (ref 44–60)
pH, Ven: 7.405 (ref 7.25–7.43)
pO2, Ven: 49 mmHg — ABNORMAL HIGH (ref 32–45)

## 2022-11-19 LAB — T4, FREE: Free T4: 0.75 ng/dL (ref 0.61–1.12)

## 2022-11-19 LAB — RAPID URINE DRUG SCREEN, HOSP PERFORMED
Amphetamines: NOT DETECTED
Barbiturates: NOT DETECTED
Benzodiazepines: NOT DETECTED
Cocaine: NOT DETECTED
Opiates: NOT DETECTED
Tetrahydrocannabinol: NOT DETECTED

## 2022-11-19 LAB — TROPONIN I (HIGH SENSITIVITY)
Troponin I (High Sensitivity): 4 ng/L (ref <18)
Troponin I (High Sensitivity): 4 ng/L (ref <18)

## 2022-11-19 LAB — LACTIC ACID, PLASMA
Lactic Acid, Venous: 2.1 mmol/L (ref 0.5–1.9)
Lactic Acid, Venous: 3 mmol/L (ref 0.5–1.9)

## 2022-11-19 LAB — SALICYLATE LEVEL: Salicylate Lvl: <7 mg/dL — ABNORMAL LOW (ref 7.0–30.0)

## 2022-11-19 LAB — CBG MONITORING, ED: Glucose-Capillary: 116 mg/dL — ABNORMAL HIGH (ref 70–99)

## 2022-11-19 LAB — AMMONIA: Ammonia: 38 umol/L — ABNORMAL HIGH (ref 9–35)

## 2022-11-19 LAB — TSH: TSH: 1.052 u[IU]/mL (ref 0.350–4.500)

## 2022-11-19 LAB — ETHANOL: Alcohol, Ethyl (B): <10 mg/dL (ref <10)

## 2022-11-19 LAB — ACETAMINOPHEN LEVEL: Acetaminophen (Tylenol), Serum: <10 ug/mL — ABNORMAL LOW (ref 10–30)

## 2022-11-19 MED ORDER — LACTATED RINGERS IV BOLUS
1000.0000 mL | Freq: Once | INTRAVENOUS | Status: AC
  Administered 2022-11-19: 1000 mL via INTRAVENOUS

## 2022-11-19 MED ORDER — SODIUM CHLORIDE 0.9 % IV SOLN: INTRAVENOUS | Status: DC

## 2022-11-19 MED ORDER — LACTULOSE 10 GM/15ML PO SOLN
20.0000 g | Freq: Once | ORAL | Status: AC
  Administered 2022-11-19: 20 g via ORAL
  Filled 2022-11-19: qty 30

## 2022-11-19 MED ORDER — NALOXONE HCL 0.4 MG/ML IJ SOLN
0.4000 mg | Freq: Once | INTRAMUSCULAR | Status: AC
  Administered 2022-11-19: 0.4 mg via INTRAVENOUS
  Filled 2022-11-19: qty 1

## 2022-11-19 NOTE — ED Notes (Signed)
Successful BM on bedside commode, patient able to transfer self to and from commode and perform self care cleaning after.

## 2022-11-19 NOTE — ED Triage Notes (Signed)
Patient here POV from Home.  Significant Other notes Patient has been extremely lethargic over 48 hours. Seen for Same yesterday.   Patient very Sleepy in Triage and responds to verbal Stimuli and may or may not answer questions.

## 2022-11-19 NOTE — Progress Notes (Addendum)
Hx of TBI in 2022 but no Hx of seizure disorder, came with wax-waning mentation for 48+ hours. Initially came to [facility] two nights ago, CT/CTA/MRI normal and re-assured and sent home. Family reported similar swinging mentation from fully awake able to do Ipad and computer work to confusion and sleepy and brought back to [facility] today. Very sleepy in the ED, no change with one dose of Narcan. Vital signs, CBC, BMP normal, ammonia only borderline elevated. Neurology recommends that patient come to [facility] for EEG study. Accepted to Tele obs at [facility]. ED also suspect conversion disorder but no previous Hx of it.

## 2022-11-19 NOTE — ED Provider Notes (Signed)
[place] EMERGENCY DEPARTMENT AT [facility] Provider Note   CSN: [id number] Arrival date & time: 11/19/22  1659     History {Add pertinent medical, surgical, social history, OB history to HPI:1} Chief Complaint  Patient presents with   Fatigue    Todd Roth is a 37 y.o. male.  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  Wife does report that for the last 48 hours he has been very sleepy most of the time, but at times will improve, walk around was able to eat and drink.  Earlier, he appeared to be feeling better, and went to go work on his schoolwork, and then she found him sleeping in his chair, and appeared to be very sleepy.  She is concerned that he is not coming back around to his baseline or maintaining that.  He has no history of seizure disorder.  Denies alcohol, drug use or new medications.  Has had headaches.  No nausea, vomiting,   History reviewed. No pertinent past medical history.   Home Medications Prior to Admission medications   Medication Sig Start Date End Date Taking? Authorizing Provider  cyclobenzaprine (FLEXERIL) 10 MG tablet Take 1 tablet (10 mg total) by mouth 2 (two) times daily as needed for muscle spasms. 08/22/22   Clark, Meghan R, PA-C  doxycycline (VIBRA-TABS) 100 MG tablet Take 1 tablet (100 mg total) by mouth 2 (two) times daily. 10/04/15   Rebecka Apley, MD  meloxicam (MOBIC) 7.5 MG tablet Take 1 tablet (7.5 mg total) by mouth daily. 08/22/22   Melton Alar R, PA-C      Allergies    Metronidazole    Review of Systems   Review of Systems  Physical Exam Updated Vital  Signs BP (!) 145/85 (BP Location: Left Arm)   Pulse 75   Temp 97.8 F (36.6 C) (Axillary)   Resp 18   Ht 5\' 7"  (1.702 m)   Wt 92.1 kg   SpO2 99%   BMI 31.80 kg/m  Physical Exam  ED Results / Procedures / Treatments   Labs (all labs ordered are listed, but only abnormal results are displayed) Labs Reviewed  CBG MONITORING, ED - Abnormal; Notable for the following components:      Result Value   Glucose-Capillary 116 (*)    All other components within normal limits  CBC WITH DIFFERENTIAL/PLATELET  COMPREHENSIVE METABOLIC PANEL  LACTIC ACID, PLASMA  LACTIC ACID, PLASMA  AMMONIA  RAPID URINE DRUG SCREEN, HOSP PERFORMED  ETHANOL  ACETAMINOPHEN LEVEL  SALICYLATE LEVEL    EKG None  Radiology MR BRAIN WO CONTRAST  Result Date: 11/18/2022 CLINICAL DATA:  37 year old male with altered mental status. Neurologic deficit. EXAM: MRI HEAD WITHOUT CONTRAST TECHNIQUE: Multiplanar, multiecho pulse sequences of the brain and surrounding structures were obtained without intravenous contrast. COMPARISON:  None Available. FINDINGS: Brain: No restricted diffusion to suggest acute infarction. No midline shift, mass effect, evidence of mass lesion, ventriculomegaly, extra-axial collection or acute intracranial hemorrhage. Cervicomedullary junction and pituitary are within normal limits. Wallace Cullens and white matter signal is within normal limits throughout the brain. No encephalomalacia or chronic cerebral blood products identified. Vascular: Major intracranial vascular flow voids are preserved. Skull  and upper cervical spine: Negative. Visualized bone marrow signal is within normal limits. Sinuses/Orbits: Orbit mild motion artifact, otherwise negative. Other: Mastoids are clear. Grossly normal visible internal auditory structures. Negative visible scalp and face. IMPRESSION: Normal noncontrast MRI appearance of the Brain. Electronically Signed   By: Odessa Fleming M.D.   On: 11/18/2022 06:11   CT ANGIO HEAD NECK W  WO CM  Result Date: 11/18/2022 CLINICAL DATA:  37 year old male with altered mental status. Neurologic deficit. EXAM: CT ANGIOGRAPHY HEAD AND NECK WITH AND WITHOUT CONTRAST TECHNIQUE: Multidetector CT imaging of the head and neck was performed using the standard protocol during bolus administration of intravenous contrast. Multiplanar CT image reconstructions and MIPs were obtained to evaluate the vascular anatomy. Carotid stenosis measurements (when applicable) are obtained utilizing NASCET criteria, using the distal internal carotid diameter as the denominator. RADIATION DOSE REDUCTION: This exam was performed according to the departmental dose-optimization program which includes automated exposure control, adjustment of the mA and/or kV according to patient size and/or use of iterative reconstruction technique. CONTRAST:  75mL OMNIPAQUE IOHEXOL 350 MG/ML SOLN COMPARISON:  Head CT 0339 hours today. FINDINGS: CTA NECK Skeleton: Negative aside from impacted or supernumerary right maxillary tooth. No acute osseous abnormality identified. Upper chest: Negative. Other neck: Negative. Aortic arch: Good contrast bolus.  Normal 3 vessel arch. Right carotid system: Negative aside from right ICA tortuosity distal to the bulb, just below the skull base. Left carotid system: Negative, similar left ICA tortuosity. Vertebral arteries: Proximal subclavian arteries and both cervical vertebral arteries are normal. Fairly codominant vertebral arteries. CTA HEAD Posterior circulation: Mildly dominant left vertebral V4 segment. Normal distal vertebral arteries, PICA origins, vertebrobasilar junction. Patent basilar artery without stenosis. Normal SCA and right PCA origins. Fetal type left PCA origin. Right posterior communicating artery diminutive or absent. Bilateral PCA branches are within normal limits. Anterior circulation: Both ICA siphons are patent. No siphon plaque or stenosis. Normal ophthalmic and left posterior  communicating artery origins. Patent carotid termini, normal MCA and ACA origins. Normal anterior communicating artery. Bilateral ACA branches are within normal limits. Left MCA M1 segment and bifurcation are patent without stenosis. Right MCA M1 segment and bifurcation are patent without stenosis. Bilateral MCA branches are within normal limits. Venous sinuses: Patent. Anatomic variants: None. Review of the MIP images confirms the above findings IMPRESSION: Normal CTA Head and Neck. Electronically Signed   By: Odessa Fleming M.D.   On: 11/18/2022 05:39   CT HEAD WO CONTRAST ( )  Result Date: 11/18/2022 CLINICAL DATA:  Altered mental status. EXAM: CT HEAD WITHOUT CONTRAST TECHNIQUE: Contiguous axial images were obtained from the base of the skull through the vertex without intravenous contrast. RADIATION DOSE REDUCTION: This exam was performed according to the departmental dose-optimization program which includes automated exposure control, adjustment of the mA and/or kV according to patient size and/or use of iterative reconstruction technique. COMPARISON:  None Available. FINDINGS: Brain: No evidence of acute infarction, hemorrhage, hydrocephalus, extra-axial collection or mass lesion/mass effect. Vascular: No hyperdense vessel or unexpected calcification. Skull: Normal. Negative for fracture or focal lesion. Sinuses/Orbits: No acute finding. Other: None. IMPRESSION: No acute intracranial pathology. Electronically Signed   By: Aram Candela M.D.   On: 11/18/2022 03:45    Procedures Procedures  {Document cardiac monitor, telemetry assessment procedure when appropriate:1}  Medications Ordered in ED Medications  naloxone (NARCAN) injection 0.4 mg (has no administration in time range)    ED Course/ Medical Decision Making/ A&P   {   Click  here for ABCD2, HEART and other calculatorsREFRESH Note before signing :1}                          Medical Decision Making Amount and/or Complexity of Data  Reviewed Labs: ordered.  Risk Prescription drug management.   ***  {Document critical care time when appropriate:1} {Document review of labs and clinical decision tools ie heart score, Chads2Vasc2 etc:1}  {Document your independent review of radiology images, and any outside records:1} {Document your discussion with family members, caretakers, and with consultants:1} {Document social determinants of health affecting pt's care:1} {Document your decision making why or why not admission, treatments were needed:1} Final Clinical Impression(s) / ED Diagnoses Final diagnoses:  None    Rx / DC Orders ED Discharge Orders     None

## 2022-11-19 NOTE — ED Notes (Signed)
Patient "sleeping",  "This is how he's been for last 48hr" Arrousable to hard sternal rub, then returned to "sleeping" state

## 2022-11-19 NOTE — ED Notes (Signed)
Patient on bedside commode to have BM.

## 2022-11-20 ENCOUNTER — Observation Stay (HOSPITAL_COMMUNITY): Payer: No Typology Code available for payment source

## 2022-11-20 ENCOUNTER — Encounter (HOSPITAL_COMMUNITY): Payer: Self-pay | Admitting: Internal Medicine

## 2022-11-20 DIAGNOSIS — K219 Gastro-esophageal reflux disease without esophagitis: Secondary | ICD-10-CM | POA: Diagnosis present

## 2022-11-20 DIAGNOSIS — R4182 Altered mental status, unspecified: Secondary | ICD-10-CM | POA: Diagnosis present

## 2022-11-20 DIAGNOSIS — F251 Schizoaffective disorder, depressive type: Secondary | ICD-10-CM | POA: Diagnosis present

## 2022-11-20 DIAGNOSIS — Z791 Long term (current) use of non-steroidal anti-inflammatories (NSAID): Secondary | ICD-10-CM | POA: Diagnosis not present

## 2022-11-20 DIAGNOSIS — E872 Acidosis, unspecified: Secondary | ICD-10-CM | POA: Diagnosis not present

## 2022-11-20 DIAGNOSIS — G928 Other toxic encephalopathy: Secondary | ICD-10-CM | POA: Diagnosis present

## 2022-11-20 DIAGNOSIS — E7220 Disorder of urea cycle metabolism, unspecified: Secondary | ICD-10-CM | POA: Diagnosis present

## 2022-11-20 DIAGNOSIS — E669 Obesity, unspecified: Secondary | ICD-10-CM | POA: Diagnosis present

## 2022-11-20 DIAGNOSIS — Z79899 Other long term (current) drug therapy: Secondary | ICD-10-CM | POA: Diagnosis not present

## 2022-11-20 DIAGNOSIS — Z888 Allergy status to other drugs, medicaments and biological substances status: Secondary | ICD-10-CM | POA: Diagnosis not present

## 2022-11-20 DIAGNOSIS — R569 Unspecified convulsions: Secondary | ICD-10-CM

## 2022-11-20 DIAGNOSIS — G9340 Encephalopathy, unspecified: Secondary | ICD-10-CM | POA: Diagnosis present

## 2022-11-20 DIAGNOSIS — G9341 Metabolic encephalopathy: Secondary | ICD-10-CM | POA: Diagnosis present

## 2022-11-20 DIAGNOSIS — Z6831 Body mass index (BMI) 31.0-31.9, adult: Secondary | ICD-10-CM | POA: Diagnosis not present

## 2022-11-20 DIAGNOSIS — T426X5A Adverse effect of other antiepileptic and sedative-hypnotic drugs, initial encounter: Secondary | ICD-10-CM | POA: Diagnosis present

## 2022-11-20 DIAGNOSIS — Z87820 Personal history of traumatic brain injury: Secondary | ICD-10-CM | POA: Diagnosis not present

## 2022-11-20 DIAGNOSIS — G8929 Other chronic pain: Secondary | ICD-10-CM | POA: Diagnosis present

## 2022-11-20 LAB — CBC WITH DIFFERENTIAL/PLATELET
Abs Immature Granulocytes: 0.01 10*3/uL (ref 0.00–0.07)
Basophils Absolute: 0 10*3/uL (ref 0.0–0.1)
Basophils Relative: 0 %
Eosinophils Absolute: 0.1 10*3/uL (ref 0.0–0.5)
Eosinophils Relative: 2 %
HCT: 35.9 % — ABNORMAL LOW (ref 39.0–52.0)
Hemoglobin: 12 g/dL — ABNORMAL LOW (ref 13.0–17.0)
Immature Granulocytes: 0 %
Lymphocytes Relative: 37 %
Lymphs Abs: 2 10*3/uL (ref 0.7–4.0)
MCH: 28.5 pg (ref 26.0–34.0)
MCHC: 33.4 g/dL (ref 30.0–36.0)
MCV: 85.3 fL (ref 80.0–100.0)
Monocytes Absolute: 0.4 10*3/uL (ref 0.1–1.0)
Monocytes Relative: 7 %
Neutro Abs: 2.9 10*3/uL (ref 1.7–7.7)
Neutrophils Relative %: 54 %
Platelets: 154 10*3/uL (ref 150–400)
RBC: 4.21 MIL/uL — ABNORMAL LOW (ref 4.22–5.81)
RDW: 14.6 % (ref 11.5–15.5)
WBC: 5.4 10*3/uL (ref 4.0–10.5)
nRBC: 0 % (ref 0.0–0.2)

## 2022-11-20 LAB — COMPREHENSIVE METABOLIC PANEL
ALT: 24 U/L (ref 0–44)
AST: 23 U/L (ref 15–41)
Albumin: 3.4 g/dL — ABNORMAL LOW (ref 3.5–5.0)
Alkaline Phosphatase: 67 U/L (ref 38–126)
Anion gap: 6 (ref 5–15)
BUN: 9 mg/dL (ref 6–20)
CO2: 25 mmol/L (ref 22–32)
Calcium: 8.3 mg/dL — ABNORMAL LOW (ref 8.9–10.3)
Chloride: 106 mmol/L (ref 98–111)
Creatinine, Ser: 0.92 mg/dL (ref 0.61–1.24)
GFR, Estimated: >60 mL/min (ref >60)
Glucose, Bld: 97 mg/dL (ref 70–99)
Potassium: 3.7 mmol/L (ref 3.5–5.1)
Sodium: 137 mmol/L (ref 135–145)
Total Bilirubin: 0.7 mg/dL (ref 0.3–1.2)
Total Protein: 6.3 g/dL — ABNORMAL LOW (ref 6.5–8.1)

## 2022-11-20 LAB — PROTIME-INR
INR: 1.1 (ref 0.8–1.2)
Prothrombin Time: 14 seconds (ref 11.4–15.2)

## 2022-11-20 LAB — LACTIC ACID, PLASMA: Lactic Acid, Venous: 1 mmol/L (ref 0.5–1.9)

## 2022-11-20 LAB — VITAMIN B12: Vitamin B-12: 379 pg/mL (ref 180–914)

## 2022-11-20 LAB — CK: Total CK: 168 U/L (ref 49–397)

## 2022-11-20 LAB — MAGNESIUM: Magnesium: 2 mg/dL (ref 1.7–2.4)

## 2022-11-20 LAB — FOLATE: Folate: 8.3 ng/mL (ref >5.9)

## 2022-11-20 MED ORDER — ONDANSETRON HCL 4 MG/2ML IJ SOLN: 4.0000 mg | Freq: Four times a day (QID) | INTRAMUSCULAR | Status: DC | PRN

## 2022-11-20 MED ORDER — ACETAMINOPHEN 650 MG RE SUPP
650.0000 mg | Freq: Four times a day (QID) | RECTAL | Status: DC | PRN
  Administered 2022-11-22: 650 mg via RECTAL

## 2022-11-20 MED ORDER — MELATONIN 3 MG PO TABS: 3.0000 mg | ORAL_TABLET | Freq: Every evening | ORAL | Status: DC | PRN

## 2022-11-20 MED ORDER — THIAMINE HCL 100 MG/ML IJ SOLN
100.0000 mg | Freq: Every day | INTRAMUSCULAR | Status: DC
  Administered 2022-11-20 – 2022-11-21 (×2): 100 mg via INTRAVENOUS
  Filled 2022-11-20 (×4): qty 2

## 2022-11-20 MED ORDER — ACETAMINOPHEN 325 MG PO TABS
650.0000 mg | ORAL_TABLET | Freq: Four times a day (QID) | ORAL | Status: DC | PRN
  Administered 2022-11-20 – 2022-11-21 (×2): 650 mg via ORAL
  Filled 2022-11-20 (×3): qty 2

## 2022-11-20 MED ORDER — PREGABALIN 75 MG PO CAPS
150.0000 mg | ORAL_CAPSULE | Freq: Two times a day (BID) | ORAL | Status: DC
  Administered 2022-11-20 – 2022-11-21 (×3): 150 mg via ORAL
  Filled 2022-11-20 (×3): qty 2

## 2022-11-20 MED ORDER — THIAMINE HCL 100 MG PO TABS
100.0000 mg | ORAL_TABLET | Freq: Every day | ORAL | Status: DC
  Administered 2022-11-22 – 2022-11-23 (×2): 100 mg via ORAL
  Filled 2022-11-20 (×6): qty 1

## 2022-11-20 NOTE — H&P (Signed)
History and Physical      Todd Roth ZOX:[id number] DOB: Nov 29, 1985 DOA: 11/19/2022  PCP: [facility]  Patient coming from: home   I have personally briefly reviewed patient's old medical records in [facility]  Chief Complaint: Altered mental status  HPI: Todd Roth is a 37 y.o. male with medical history significant for seizure disorder, who is admitted to [facility] on 11/19/2022 by way of transfer from [facility] emergency department with acute encephalopathy after presenting from home to [facility] ED complaining of altered mental status.   History provided by the patient, the patient's family, as well as via chart review.  Family is noted the patient to be intermittently confused, somnolent over the course of the last 2 days.  They convey that the patient's mental status improves back to baseline, which the patient is able to carry out his ADLs at baseline capabilities, before experiencing additional altered mental status, confusion, somnolence.  This is not associated with any known tongue biting, tonic-clonic activity, or loss of bowel/bladder function.  No associate with any subjective fever, chills, rigors, or generalized myalgias.  No recent headache, neck stiffness, rash, cough, shortness of breath, abdominal pain, diarrhea, dysuria or gross hematuria.  No recent preceding trauma.  Patient denies any recreational drug use, or any recent/recurrent alcohol consumption.  He has a documented history of seizure disorder, but is not currently on any scheduled antiepileptic medications as an outpatient.  Outpatient medications notable for prn Flexeril as well as scheduled Mobic.  In the setting of his intermittent episodes of altered mental status over the course the last 2 days, the patient had originally presented to [facility] emergency department on 11/17/2020 for for evaluation of such, at which time he underwent CT head, CTA head and neck, as well as  MRI brain, all of which were reported to be reassuring and without significant acute process.  He was subsidy discharged home from [facility] emergency department on 11/18/2022.  However, in the setting of further recurrence of episodes of altered mental status, presented back to [facility] emergency department on 11/19/2022 for further evaluation management thereof.    [facility] ED Course:  Vital signs in the ED were notable for the following: Afebrile; heart rate 60 to 80s; systolic blood pressures in the 120s to 130s; respiratory rate 14-23, oxygen saturation 96 9% on room air.  Labs were notable for the following: CMP notable for the following: Sodium 140, bicarbonate 26, creatinine 1.02, glucose 92, liver enzymes within limits.  Ammonia 38, without any prior serum ammonia level available for reported comparison.  High-sensitivity troponin x 2 values without before.  Salicylate less than 7, serum ethanol level less than 10.  TSH 1.052, free T40.75.  Initial lactic acid 2.1, followed by a 3.0.  CBC notable for white cell count 6500, hemoglobin 14.  Urinalysis grossly negative.  Per my interpretation, EKG in ED demonstrated the following: Sinus rhythm heart rate 77, normal intervals, no evidence of T wave ST changes.  Imaging and additional notable ED work-up: Noncontrast CT head which showed no evidence of acute intracranial process conclusion significant hemorrhage or any evidence of acute infarct.  EDP discussed patient's case with the on-call neurologist, who recommended transfer to [facility] for further evaluation via EEG.  While in the ED, the following were administered: Lactulose 20 g p.o. x 1 dose, Narcan 0.5 mg IV x 1, following which there is no noted improvement in the patient's altered mental status, lactated Ringer's is 1  L bolus, followed by normal saline at 150 cc/h.  Subsequently, the patient was admitted to [facility] for further evaluation management of presenting acute  encephalopathy, with presenting labs notable for lactic acidosis.     Review of Systems: As per HPI otherwise 10 point review of systems negative.   Past Medical History:  Diagnosis Date   Seizure disorder     Past Surgical History:  Procedure Laterality Date   eye surgery     REFRACTIVE SURGERY      Social History:  reports that he has never smoked. He has never used smokeless tobacco. He reports that he does not drink alcohol and does not use drugs.   Allergies  Allergen Reactions   Metronidazole Diarrhea    History reviewed. No pertinent family history.  Family history reviewed and not pertinent    Prior to Admission medications   Medication Sig Start Date End Date Taking? Authorizing Provider  cyclobenzaprine (FLEXERIL) 10 MG tablet Take 1 tablet (10 mg total) by mouth 2 (two) times daily as needed for muscle spasms. 08/22/22   Clark, Meghan R, PA-C  doxycycline (VIBRA-TABS) 100 MG tablet Take 1 tablet (100 mg total) by mouth 2 (two) times daily. 10/04/15   Rebecka Apley, MD  meloxicam (MOBIC) 7.5 MG tablet Take 1 tablet (7.5 mg total) by mouth daily. 08/22/22   Melton Alar R, PA-C     Objective    Physical Exam: Vitals:   11/20/22 0046 11/20/22 0047 11/20/22 0048 11/20/22 0049  BP:   126/82 126/82  Pulse:    67  Resp: Temp:   98.4 F (36.9 C) 98.4 F (36.9 C)  TempSrc:    Axillary  SpO2:    96%  Weight:      Height:        General: appears to be stated age; oriented Skin: warm, dry, no rash Head:  AT/[facility] Mouth:  Oral mucosa membranes appear moist, normal dentition Neck: supple; trachea midline Heart:  RRR; did not appreciate any M/R/G Lungs: CTAB, did not appreciate any wheezes, rales, or rhonchi Abdomen: + BS; soft, ND, NT Vascular: 2+ pedal pulses b/l; 2+ radial pulses b/l Extremities: no peripheral edema, no muscle wasting Neuro: strength and sensation intact in upper and lower extremities b/l   Labs on Admission: I have  personally reviewed following labs and imaging studies  CBC: Recent Labs  Lab 11/18/22 0040 11/18/22 0317 11/18/22 0318 11/19/22 1722 11/19/22 1915  WBC 7.9  --   --  6.5  --   NEUTROABS 4.5  --   --  3.8  --   HGB 12.1* 12.9* 13.3 14.0 12.9*  HCT 37.0* 38.0* 39.0 43.1 38.0*  MCV 86.0  --   --  87.1  --   PLT 173  --   --  191  --    Basic Metabolic Panel: Recent Labs  Lab 11/18/22 0040 11/18/22 0317 11/18/22 0318 11/19/22 1722 11/19/22 1915  NA 139 140 140 140 139  K 3.2* 3.6 3.6 3.6 3.6  CL 102 104  --  104  --   CO2 25  --   --  26  --   GLUCOSE 99 99  --  92  --   BUN 9 10  --  13  --   CREATININE 1.11 0.90  --  1.02  --   CALCIUM 9.1  --   --  9.4  --    GFR: Estimated Creatinine  Clearance: 109.4 mL/min (by C-G formula based on SCr of 1.02 mg/dL). Liver Function Tests: Recent Labs  Lab 11/18/22 0040 11/19/22 1722  AST 40 25  ALT 33 25  ALKPHOS 69 71  BILITOT 0.8 0.6  PROT 7.1 7.6  ALBUMIN 4.0 4.4   No results for input(s): "LIPASE", "AMYLASE" in the last 168 hours. Recent Labs  Lab 11/19/22 1723  AMMONIA 38*   Coagulation Profile: No results for input(s): "INR", "PROTIME" in the last 168 hours. Cardiac Enzymes: No results for input(s): "CKTOTAL", "CKMB", "CKMBINDEX", "TROPONINI" in the last 168 hours. BNP (last 3 results) No results for input(s): "PROBNP" in the last 8760 hours. HbA1C: No results for input(s): "HGBA1C" in the last 72 hours. CBG: Recent Labs  Lab 11/19/22 1712  GLUCAP 116*   Lipid Profile: No results for input(s): "CHOL", "HDL", "LDLCALC", "TRIG", "CHOLHDL", "LDLDIRECT" in the last 72 hours. Thyroid Function Tests: Recent Labs    11/19/22 1820  TSH 1.052  FREET4 0.75   Anemia Panel: No results for input(s): "VITAMINB12", "FOLATE", "FERRITIN", "TIBC", "IRON", "RETICCTPCT" in the last 72 hours. Urine analysis:    Component Value Date/Time   COLORURINE YELLOW (A) 10/04/2015 0122   APPEARANCEUR CLEAR (A) 10/04/2015  0122   LABSPEC 1.030 10/04/2015 0122   PHURINE 6.0 10/04/2015 0122   GLUCOSEU NEGATIVE 10/04/2015 0122   HGBUR NEGATIVE 10/04/2015 0122   BILIRUBINUR NEGATIVE 10/04/2015 0122   KETONESUR NEGATIVE 10/04/2015 0122   PROTEINUR NEGATIVE 10/04/2015 0122   NITRITE NEGATIVE 10/04/2015 0122   LEUKOCYTESUR NEGATIVE 10/04/2015 0122    Radiological Exams on Admission: CT Head Wo Contrast  Result Date: 11/19/2022 CLINICAL DATA:  Altered mental status. EXAM: CT HEAD WITHOUT CONTRAST TECHNIQUE: Contiguous axial images were obtained from the base of the skull through the vertex without intravenous contrast. RADIATION DOSE REDUCTION: This exam was performed according to the departmental dose-optimization program which includes automated exposure control, adjustment of the mA and/or kV according to patient size and/or use of iterative reconstruction technique. COMPARISON:  11/18/2022 FINDINGS: Brain: No evidence of acute infarction, hemorrhage, hydrocephalus, extra-axial collection or mass lesion/mass effect. Vascular: No hyperdense vessel or unexpected calcification. Skull: Normal. Negative for fracture or focal lesion. Sinuses/Orbits: No acute finding. Other: None. IMPRESSION: No acute intracranial pathology. Electronically Signed   By: Signa Kell M.D.   On: 11/19/2022 18:11   MR BRAIN WO CONTRAST  Result Date: 11/18/2022 CLINICAL DATA:  37 year old male with altered mental status. Neurologic deficit. EXAM: MRI HEAD WITHOUT CONTRAST TECHNIQUE: Multiplanar, multiecho pulse sequences of the brain and surrounding structures were obtained without intravenous contrast. COMPARISON:  None Available. FINDINGS: Brain: No restricted diffusion to suggest acute infarction. No midline shift, mass effect, evidence of mass lesion, ventriculomegaly, extra-axial collection or acute intracranial hemorrhage. Cervicomedullary junction and pituitary are within normal limits. Wallace Cullens and white matter signal is within normal limits  throughout the brain. No encephalomalacia or chronic cerebral blood products identified. Vascular: Major intracranial vascular flow voids are preserved. Skull and upper cervical spine: Negative. Visualized bone marrow signal is within normal limits. Sinuses/Orbits: Orbit mild motion artifact, otherwise negative. Other: Mastoids are clear. Grossly normal visible internal auditory structures. Negative visible scalp and face. IMPRESSION: Normal noncontrast MRI appearance of the Brain. Electronically Signed   By: Odessa Fleming M.D.   On: 11/18/2022 06:11   CT ANGIO HEAD NECK W WO CM  Result Date: 11/18/2022 CLINICAL DATA:  37 year old male with altered mental status. Neurologic deficit. EXAM: CT ANGIOGRAPHY HEAD AND NECK WITH AND WITHOUT  CONTRAST TECHNIQUE: Multidetector CT imaging of the head and neck was performed using the standard protocol during bolus administration of intravenous contrast. Multiplanar CT image reconstructions and MIPs were obtained to evaluate the vascular anatomy. Carotid stenosis measurements (when applicable) are obtained utilizing NASCET criteria, using the distal internal carotid diameter as the denominator. RADIATION DOSE REDUCTION: This exam was performed according to the departmental dose-optimization program which includes automated exposure control, adjustment of the mA and/or kV according to patient size and/or use of iterative reconstruction technique. CONTRAST:  75mL OMNIPAQUE IOHEXOL 350 MG/ML SOLN COMPARISON:  Head CT 0339 hours today. FINDINGS: CTA NECK Skeleton: Negative aside from impacted or supernumerary right maxillary tooth. No acute osseous abnormality identified. Upper chest: Negative. Other neck: Negative. Aortic arch: Good contrast bolus.  Normal 3 vessel arch. Right carotid system: Negative aside from right ICA tortuosity distal to the bulb, just below the skull base. Left carotid system: Negative, similar left ICA tortuosity. Vertebral arteries: Proximal subclavian  arteries and both cervical vertebral arteries are normal. Fairly codominant vertebral arteries. CTA HEAD Posterior circulation: Mildly dominant left vertebral V4 segment. Normal distal vertebral arteries, PICA origins, vertebrobasilar junction. Patent basilar artery without stenosis. Normal SCA and right PCA origins. Fetal type left PCA origin. Right posterior communicating artery diminutive or absent. Bilateral PCA branches are within normal limits. Anterior circulation: Both ICA siphons are patent. No siphon plaque or stenosis. Normal ophthalmic and left posterior communicating artery origins. Patent carotid termini, normal MCA and ACA origins. Normal anterior communicating artery. Bilateral ACA branches are within normal limits. Left MCA M1 segment and bifurcation are patent without stenosis. Right MCA M1 segment and bifurcation are patent without stenosis. Bilateral MCA branches are within normal limits. Venous sinuses: Patent. Anatomic variants: None. Review of the MIP images confirms the above findings IMPRESSION: Normal CTA Head and Neck. Electronically Signed   By: Odessa Fleming M.D.   On: 11/18/2022 05:39   CT HEAD WO CONTRAST ( )  Result Date: 11/18/2022 CLINICAL DATA:  Altered mental status. EXAM: CT HEAD WITHOUT CONTRAST TECHNIQUE: Contiguous axial images were obtained from the base of the skull through the vertex without intravenous contrast. RADIATION DOSE REDUCTION: This exam was performed according to the departmental dose-optimization program which includes automated exposure control, adjustment of the mA and/or kV according to patient size and/or use of iterative reconstruction technique. COMPARISON:  None Available. FINDINGS: Brain: No evidence of acute infarction, hemorrhage, hydrocephalus, extra-axial collection or mass lesion/mass effect. Vascular: No hyperdense vessel or unexpected calcification. Skull: Normal. Negative for fracture or focal lesion. Sinuses/Orbits: No acute finding. Other:  None. IMPRESSION: No acute intracranial pathology. Electronically Signed   By: Aram Candela M.D.   On: 11/18/2022 03:45      Assessment/Plan   Principal Problem:   Acute encephalopathy Active Problems:   Lactic acidosis      #) Acute encephalopathy: Intermittent episodes of altered mental status, confusion, somnolence over the course of the last 2 days, which appear to transiently improve back to baseline mental status/level of baseline functionality before considering redevelopment of altered mental status.  Etiology not entirely clear at this time.  However, given the intermittent concomitant with a documented history of seizure disorder in the absence of taking any antiepileptic medications as an outpatient, nature of these episodes differential includes the possibility of recurrent seizure episodes, but with the element of interval return to mental status baseline reassuring from the standpoint of diminished likelihood of status epilepticus, although criteria could possibly be met on the  basis of frequency.  EDP at [facility] discussed patient's case with on-call neurology, who recommended transfer to [facility] for EEG monitoring.  Additionally, given the intermittent nature of these episodes, differential also includes potential from pharmacologic factors, noting that the patient is on central acting medications as an outpatient, including prn Flexeril, which will be held for now.  His urinary drug screen was found to be negative.  Manage also includes conversion disorder.  No overt metabolic contributing factors identified at this time, although he is noted to possess a mild lactic acidosis, which could be consistent with intermittent seizures.  TSH within normal limits.  Today CT head showed no evidence of acute intracranial process, and needed undergone even more extensive imaging of the head on 11/17/2021, which time MRI brain, CT head, CTA head and neck showed no evidence of acute  process.  No obvious additional contributory underlying infectious process at this time.  However, will pursue urinalysis to further evaluate.  Will also check formal VBG to evaluate for any contribution from hypercapnic encephalopathy.      Plan: fall precautions. Repeat CMP/CBC in the AM. Check magnesium level. check vbg .  EEG monitoring per neurology recommendation, as above.  Hold home prn Flexeril.  Check urinalysis, CPK level, B12 level.  Check INR.  Fall precautions ordered.       #) Lactic acidosis: Initial lactate 2.1, therapy value trending up slightly to 3.0.  This could potentially be consistent with concern for recurrent seizures over the last few days, as further detailed above.  As noted above, no overt source of underlying infection, We will SIRS criteria not really met.  Overall, criteria for sepsis not currently met.  Additionally, serum ethanol level nonelevated while urinary drug screen was pan negative.  Given presenting intermittent episodes of altered mental status, also check CPK level.  Plan: Continuous activators at 150 cc/h.  Repeat lactic acid level in the morning.  Further evaluation for underlying seizures, including EEG, as above.  Check INR.  Repeat CMP, CBC in the morning.  Monitor strict I's and O's and daily weights.  Check urinalysis.       DVT prophylaxis: SCD's   Code Status: Full code Family Communication: none Disposition Plan: Per Rounding Team Consults called: none;  Admission status: obs     I SPENT GREATER THAN 75  MINUTES IN CLINICAL CARE TIME/MEDICAL DECISION-MAKING IN COMPLETING THIS ADMISSION.      Chaney Born Avanni Turnbaugh DO [facility]  From 7PM - 7AM   11/20/2022, 1:34 AM

## 2022-11-20 NOTE — Consult Note (Signed)
NEUROLOGY CONSULTATION NOTE   Date of service: November 20, 2022 Todd Roth Name: Todd Roth MRN:  [id number] DOB:  Mar 23, 1986 Reason for consult: "somnolence, confusion" Requesting Provider: Glade Lloyd, MD _ _ _   _ __   _ __ _ _  __ __   _ __   __ _  History of Present Illness  Todd Roth is a 37 y.o. 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  Per wife who is a Char[employer] fundraiser, she works night shift. She last saw him on Friday night. Saturday, Todd Roth left for work. In the afternoon, he was was supposed to text her when he got home in about an hour but never texted her. Next she gets a phone call from his friend who found him sleeping in a [place] truck stop in his [place]. Todd Roth who is somnolent but able to still provide some history reports that he was driving the [place] and could barely keep his eyes open. Left eye closed and then felt like right eye was closing too so he had to pull into the closest parking stop and went to sleep in the [place]. He has been somnolent since. Friend found him in the [place] and he was very difficult to arouse and called EMS who brought him to [facility].  Wife reports he snores a lot, excessive daytime sleepiness. Pt reports that he is yawning throughout the day and has to drink coffee to stay awake. Wife also mentions an incident on Thursday where Todd Roth had an argument with his friend about the car that he had lend to his friend for temporary use. Wife reports that Todd Roth was very difficult to calm down and redirect.  Eats a good mix of meat, vegetables and dairy. No hx of seizures but since his TBI/concussion in 2022, he has intermittent episodes of starring off. Wife also endorses that he is a little depressed.  No recent fever. He is on lyrica and propranolol at home. Does not smoke, no EtOH, no recreational substances.   ROS   Constitutional Denies weight loss, fever and chills.   HEENT Denies changes in  vision and hearing.   Respiratory Denies SOB and cough.   CV Denies palpitations and CP   GI Denies abdominal pain, nausea, vomiting and diarrhea.   GU Denies dysuria and urinary frequency.   MSK Denies myalgia and joint pain.   Skin Denies rash and pruritus.   Neurological Denies headache and syncope.   Psychiatric Denies recent changes in mood. Denies anxiety and depression.    Past History   Past Medical History:  Diagnosis Date   Seizure disorder    Past Surgical History:  Procedure Laterality Date   eye surgery     REFRACTIVE SURGERY     History reviewed. No pertinent family history. Social History   Socioeconomic History   Marital status: Married    Spouse name: Not on file   Number of children: Not on file   Years of education: Not on file   Highest education level: Not on file  Occupational History   Not on file  Tobacco Use   Smoking status: Never   Smokeless tobacco: Never  Substance and Sexual Activity   Alcohol use: No   Drug use: No   Sexual activity: Not on file  Other Topics Concern   Not on file  Social History Narrative   Not on file   Social Determinants of Health  Financial Resource Strain: Not on file  Food Insecurity: No Food Insecurity (11/20/2022)   Hunger Vital Sign    Worried About Running Out of Food in the Last Year: Never true    Ran Out of Food in the Last Year: Never true  Transportation Needs: No Transportation Needs (11/20/2022)   PRAPARE - Administrator, Civil Service (Medical): No    Lack of Transportation (Non-Medical): No  Physical Activity: Not on file  Stress: Not on file  Social Connections: Not on file   Allergies  Allergen Reactions   Metronidazole Diarrhea    Medications   Medications Prior to Admission  Medication Sig Dispense Refill Last Dose   cyclobenzaprine (FLEXERIL) 10 MG tablet Take 1 tablet (10 mg total) by mouth 2 (two) times daily as needed for muscle spasms. 20 tablet 0    doxycycline  (VIBRA-TABS) 100 MG tablet Take 1 tablet (100 mg total) by mouth 2 (two) times daily. 14 tablet 0    meloxicam (MOBIC) 7.5 MG tablet Take 1 tablet (7.5 mg total) by mouth daily. 10 tablet 0      Vitals   Vitals:   11/20/22 0048 11/20/22 0049 11/20/22 0548 11/20/22 0749  BP: 126/82 126/82 111/68 108/67  Pulse:  67 65 79  Resp: Temp: 98.4 F (36.9 C) 98.4 F (36.9 C) 98 F (36.7 C) 98.3 F (36.8 C)  TempSrc:  Axillary  Oral  SpO2:  96% 96% 96%  Weight:      Height:         Body mass index is 31.8 kg/m.  Physical Exam   General: Laying comfortably in bed; in no acute distress.  HENT: Normal oropharynx and mucosa. Normal external appearance of ears and nose.  Neck: Supple, no pain or tenderness  CV: No JVD. No peripheral edema.  Pulmonary: Symmetric Chest rise. Normal respiratory effort.  Abdomen: Soft to touch, non-tender.  Ext: No cyanosis, edema, or deformity  Skin: No rash. Normal palpation of skin.   Musculoskeletal: Normal digits and nails by inspection. No clubbing.   Neurologic Examination  Mental status/Cognition: somnolent, partially opens eyes, oriented to self, place, month and year, poor attention. Able to do calculations, spell world backwards and days in a week backwards. Speech/language: Fluent, comprehension intact, object naming intact, repetition intact.  Cranial nerves:   CN II Pupils equal and reactive to light, no VF deficits    CN III,IV,VI EOM intact, no gaze preference or deviation, no nystagmus    CN V normal sensation in V1, V2, and V3 segments bilaterally    CN VII no asymmetry, no nasolabial fold flattening    CN VIII normal hearing to speech    CN IX & X normal palatal elevation, no uvular deviation    CN XI 5/5 head turn and 5/5 shoulder shrug bilaterally    CN XII midline tongue protrusion    Motor:  Muscle bulk: normal, tone normal, pronator drift none tremor none Mvmt Root Nerve  Muscle Right Left Comments  SA C5/6 Ax  Deltoid 5 5   EF C5/6 Mc Biceps 5 5   EE C6/7/8 Rad Triceps 5 5   WF C6/7 Med FCR     WE C7/8 PIN ECU     F Ab C8/T1 U ADM/FDI 5 5   HF L1/2/3 Fem Illopsoas 5 5   KE L2/3/4 Fem Quad 5 5   DF L4/5 D Peron Tib Ant 5 5   PF  S1/2 Tibial Grc/Sol 5 5    Sensation:  Light touch Intact throughout   Pin prick    Temperature    Vibration   Proprioception    Coordination/Complex Motor:  - Finger to Nose intact BL - Heel to shin unable to get him to do - Rapid alternating movement are slowed BL - Gait: Deferred.  Labs   CBC:  Recent Labs  Lab 11/19/22 1722 11/19/22 1915 11/20/22 0119  WBC 6.5  --  5.4  NEUTROABS 3.8  --  2.9  HGB 14.0 12.9* 12.0*  HCT 43.1 38.0* 35.9*  MCV 87.1  --  85.3  PLT 191  --  154    Basic Metabolic Panel:  Lab Results  Component Value Date   NA 137 11/20/2022   K 3.7 11/20/2022   CO2 25 11/20/2022   GLUCOSE 97 11/20/2022   BUN 9 11/20/2022   CREATININE 0.92 11/20/2022   CALCIUM 8.3 (L) 11/20/2022   GFRNONAA >60 11/20/2022   GFRAA >60 10/04/2015   Lipid Panel: No results found for: "LDLCALC" HgbA1c: No results found for: "HGBA1C" Urine Drug Screen:     Component Value Date/Time   LABOPIA NONE DETECTED 11/19/2022 1903   COCAINSCRNUR NONE DETECTED 11/19/2022 1903   LABBENZ NONE DETECTED 11/19/2022 1903   AMPHETMU NONE DETECTED 11/19/2022 1903   THCU NONE DETECTED 11/19/2022 1903   LABBARB NONE DETECTED 11/19/2022 1903    Alcohol Level     Component Value Date/Time   ETH <10 11/19/2022 1723    CT Head without contrast(Personally reviewed): CTH was negative for a large hypodensity concerning for a large territory infarct or hyperdensity concerning for an ICH  CT angio Head and Neck with contrast(Personally reviewed): No LVO  MRI Brain(Personally reviewed): No acute abnormalities  rEEG:  normal  Impression   ODES LOLLI is a 37 y.o. 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 Etiology unclear. Considering parasomnia, OSA, sedation from lyrica, potential seizures from TBI.   Seems very acute in onset but on discussion, has had longterm excessive daytime sleepiness, snoring and yawning. Wife also reports episode of starring off intermittently since his TBI in 2022, ?seizures. Also seems to be on a high dose of Lyrica at home and will cut it down to 150 BID.  Recommendations  - LTM EEG overnight - likely will need sleep study outpatient - Thiamine, Folate levels. B12 and TSH pending. - will reduce Lyrica to  BID from home dose of  BID. ______________________________________________________________________  Plan discussed with Dr. Hanley Ben over secure chat.  Thank you for the opportunity to take part in the care of this Todd Roth. If you have any further questions, please contact the neurology consultation attending.  Signed,  Erick Blinks [facility] Pager Number [phone] _ _ _   _ __   _ __ _ _  __ __   _ __   __ _

## 2022-11-20 NOTE — Progress Notes (Signed)
   11/20/22 0548  Assess: MEWS Score  Temp 98 F (36.7 C)  BP 111/68  MAP (mmHg) 81  Pulse Rate 65  ECG Heart Rate 66  Resp 17  SpO2 96 %  Assess: MEWS Score  MEWS Temp 0  MEWS Systolic 0  MEWS Pulse 0  MEWS RR 0  MEWS LOC 2  MEWS Score 2  MEWS Score Color Yellow  Assess: if the MEWS score is Yellow or Red  Were vital signs taken at a resting state? Yes  Focused Assessment No change from prior assessment  Does the patient meet 2 or more of the SIRS criteria? No  MEWS guidelines implemented  No, altered LOC is baseline  Assess: SIRS CRITERIA  SIRS Temperature  0  SIRS Pulse 0  SIRS Respirations  0  SIRS WBC 0  SIRS Score Sum  0

## 2022-11-20 NOTE — Progress Notes (Addendum)
Patient admitted earlier this morning for altered mental status.  EEG has been done.  Patient seen and examined at bedside and plan of care discussed with him and wife at bedside.  Patient is awake but very slow to respond. I have reviewed patient's medical records including his medication, current vitals, medications and labs myself.  Vitamin B12 normal.  Will consult neurology.  Monitor mental status.  Fall precautions.  Continue IV fluids.

## 2022-11-20 NOTE — Progress Notes (Signed)
LTM EEG hooked up and running - no initial skin breakdown - push button tested - [facility] monitoring.  

## 2022-11-20 NOTE — Procedures (Signed)
Patient Name: Todd Roth  MRN: [id number]  Epilepsy Attending: Charlsie Quest  Referring Physician/Provider: Angie Fava, DO  Date: 11/20/2022  Duration: 26.44 mins  Patient history: 37yo M with with ams getting eeg to evaluate for seizure.  Level of alertness: Awake, asleep  AEDs during EEG study: None  Technical aspects: This EEG study was done with scalp electrodes positioned according to the 10-20 International system of electrode placement. Electrical activity was reviewed with band pass filter of 1-70Hz , sensitivity of 7 uV/mm, display speed of 70mm/sec with a  notched filter applied as appropriate. EEG data were recorded continuously and digitally stored.  Video monitoring was available and reviewed as appropriate.  Description: The posterior dominant rhythm consists of 10-11 Hz activity of moderate voltage (25-35 uV) seen predominantly in posterior head regions, symmetric and reactive to eye opening and eye closing. Sleep was characterized by vertex waves, sleep spindles (12 to 14 Hz), maximal frontocentral region. Physiologic photic driving was seen during photic stimulation. Hyperventilation was not performed.     IMPRESSION: This study is within normal limits. No seizures or epileptiform discharges were seen throughout the recording.  A normal interictal EEG does not exclude the diagnosis of epilepsy.  Cimone Fahey Annabelle Harman

## 2022-11-20 NOTE — TOC Initial Note (Addendum)
Transition of Care [facility]) - Initial/Assessment Note    Patient Details  Name: Todd Roth MRN: [id number] Date of Birth: [birth year]  Transition of Care [facility] CM/SW Contact:    Lawerance Sabal, RN Phone Number: 11/20/2022, 2:35 PM  Clinical Narrative:                  Sherron Monday w wife at bedside, patient not able to participate well in assessment due to admitting problems.  Wife states that he gets care at the [facility], and his baseline is without deficit.  Notified April Alexander, transfer coordinator at the [facility] of admission.   Per April she des not [employer] of providers or contact information.  Call [facility] [phone] ext [phone] for any DC needs   [facility] called and stated patient is 100% service connected. That if we need any services set up we can also call Wonda Cerise at [phone]   Expected Discharge Plan: Home w Home Health Services Barriers to Discharge: Continued Medical Work up   Patient Goals and CMS Choice Patient states their goals for this hospitalization and ongoing recovery are:: to go home when well          Expected Discharge Plan and Services   Discharge Planning Services: CM Consult   Living arrangements for the past 2 months: Single Family Home                                      Prior Living Arrangements/Services Living arrangements for the past 2 months: Single Family Home Lives with:: Spouse                   Activities of Daily Living Home Assistive Devices/Equipment: None ADL Screening (condition at time of admission) Patient's cognitive ability adequate to safely complete daily activities?: No Is the patient deaf or have difficulty hearing?: No Does the patient have difficulty seeing, even when wearing glasses/contacts?: No Does the patient have difficulty concentrating, remembering, or making decisions?: No Patient able to express need for assistance with ADLs?: Yes Does the patient have difficulty  dressing or bathing?: No Independently performs ADLs?: No Communication: Independent Dressing (OT): Needs assistance Is this a change from baseline?: Pre-admission baseline Grooming: Needs assistance Is this a change from baseline?: Pre-admission baseline Feeding: Needs assistance Is this a change from baseline?: Pre-admission baseline Bathing: Needs assistance Is this a change from baseline?: Pre-admission baseline Toileting: Needs assistance Is this a change from baseline?: Pre-admission baseline In/Out Bed: Needs assistance Is this a change from baseline?: Pre-admission baseline Walks in Home: Needs assistance Is this a change from baseline?: Pre-admission baseline Does the patient have difficulty walking or climbing stairs?: Yes Weakness of Legs: None Weakness of Arms/Hands: None  Permission Sought/Granted                  Emotional Assessment              Admission diagnosis:  AMS (altered mental status) [R41.82] Altered mental status [R41.82] Patient Active Problem List   Diagnosis Date Noted   Acute encephalopathy 11/20/2022   Lactic acidosis 11/20/2022   Altered mental status 11/20/2022   AMS (altered mental status) 11/19/2022   PCP:  [facility]   [facility] [address] [place] - [address] [place] [address] Phone: [phone] Fax: [phone]  Social Determinants of Health (SDOH) Social History: SDOH Screenings   Food Insecurity: No Food Insecurity (11/20/2022)  Housing: Low Risk  (11/20/2022)  Transportation Needs: No Transportation Needs (11/20/2022)  Utilities: Not At Risk (11/20/2022)  Tobacco Use: Low Risk  (11/20/2022)   SDOH Interventions:     Readmission Risk Interventions     No data to display

## 2022-11-20 NOTE — Progress Notes (Signed)
EEG complete - results pending 

## 2022-11-20 NOTE — Evaluation (Signed)
Physical Therapy Evaluation Patient Details Name: Todd Roth MRN: [id number] DOB: 1985/11/21 Today's Date: 11/20/2022  History of Present Illness  37 y.o. male presents to [facility] hospital on 11/19/2022 with AMS. EEG negative. PMH includes TBI and seizure disorder, however spouse denies any history of seizures.  Clinical Impression  Pt presents to PT with deficits in cognition, gait, balance, endurance. Pt demonstrates significantly impaired processing and difficulties with expressive communication throughout session, making formal assessment of strength and balance difficult at this time. Pt is able to ambulate within the room and perform toileting/hand washing without physical assistance. Pt will benefit from frequent mobilization in an effort to return to independence. PT recommends outpatient PT at this time, however if cognition improves pt may return to baseline quickly.       Recommendations for follow up therapy are one component of a multi-disciplinary discharge planning process, led by the attending physician.  Recommendations may be updated based on patient status, additional functional criteria and insurance authorization.  Follow Up Recommendations       Assistance Recommended at Discharge Frequent or constant Supervision/Assistance  Patient can return home with the following  A little help with walking and/or transfers;A lot of help with bathing/dressing/bathroom;Assistance with cooking/housework;Assistance with feeding;Direct supervision/assist for medications management;Direct supervision/assist for financial management;Assist for transportation;Help with stairs or ramp for entrance    Equipment Recommendations None recommended by PT  Recommendations for Other Services       Functional Status Assessment Patient has had a recent decline in their functional status and demonstrates the ability to make significant improvements in function in a reasonable and predictable  amount of time.     Precautions / Restrictions Precautions Precautions: Fall Restrictions Weight Bearing Restrictions: No      Mobility  Bed Mobility Overal bed mobility: Needs Assistance Bed Mobility: Supine to Sit, Sit to Supine     Supine to sit: Supervision Sit to supine: Supervision        Transfers Overall transfer level: Needs assistance Equipment used: None Transfers: Sit to/from Stand Sit to Stand: Min guard                Ambulation/Gait Ambulation/Gait assistance: Min guard Gait Distance (Feet): 50 Feet Assistive device: None Gait Pattern/deviations: Step-through pattern Gait velocity: reduced Gait velocity interpretation: <1.31 ft/sec, indicative of household ambulator   General Gait Details: slowed step-through gait, reduced stride length, increased time to turn. Pt with significantly slowed processing and poor ability to report symptoms, PT defers dynamic gait tasks or further ambulation distances due to safety concerns  Stairs            Wheelchair Mobility    Modified Rankin (Stroke Patients Only)       Balance Overall balance assessment: Needs assistance Sitting-balance support: No upper extremity supported, Feet supported Sitting balance-Leahy Scale: Good     Standing balance support: No upper extremity supported, During functional activity Standing balance-Leahy Scale: Fair                               Pertinent Vitals/Pain Pain Assessment Pain Assessment: Faces Faces Pain Scale: Hurts little more Pain Location: head (R posterior) Pain Descriptors / Indicators: Headache Pain Intervention(s): Monitored during session    Home Living Family/patient expects to be discharged to:: Private residence Living Arrangements: Spouse/significant other Available Help at Discharge: Family;Available PRN/intermittently Type of Home: Apartment Home Access: Stairs to enter Entrance Stairs-Rails:  (one rail) Entrance  Stairs-Number of Steps: flight   Home Layout: One level Home Equipment: None      Prior Function Prior Level of Function : Independent/Modified Independent;Working/employed;Driving             Mobility Comments: works 2 jobs       Higher education careers adviser        Extremity/Trunk Assessment   Upper Extremity Assessment Upper Extremity Assessment: Difficult to assess due to impaired cognition (moves against gravity)    Lower Extremity Assessment Lower Extremity Assessment: Difficult to assess due to impaired cognition (appears functional but unable to formally assess)    Cervical / Trunk Assessment Cervical / Trunk Assessment: Normal  Communication   Communication: Expressive difficulties;Receptive difficulties (very slowed processing)  Cognition Arousal/Alertness: Awake/alert Behavior During Therapy: Flat affect Overall Cognitive Status: Impaired/Different from baseline Area of Impairment: Attention, Memory, Following commands, Safety/judgement, Awareness, Problem solving                   Current Attention Level: Focused Memory: Decreased recall of precautions, Decreased short-term memory Following Commands: Follows multi-step commands inconsistently, Follows one step commands with increased time Safety/Judgement: Decreased awareness of safety, Decreased awareness of deficits Awareness: Intellectual Problem Solving: Slow processing, Decreased initiation, Requires verbal cues, Difficulty sequencing          General Comments General comments (skin integrity, edema, etc.): VSS on RA, BP stable pre and post-mobility    Exercises     Assessment/Plan    PT Assessment Patient needs continued PT services  PT Problem List Decreased activity tolerance;Decreased balance;Decreased mobility;Decreased cognition;Decreased knowledge of use of DME;Decreased safety awareness;Decreased knowledge of precautions       PT Treatment Interventions DME instruction;Gait  training;Stair training;Functional mobility training;Therapeutic activities;Therapeutic exercise;Balance training;Neuromuscular re-education;Cognitive remediation;Patient/family education    PT Goals (Current goals can be found in the Care Plan section)  Acute Rehab PT Goals Patient Stated Goal: to return to baseline PT Goal Formulation: With patient Time For Goal Achievement: 12/04/22 Potential to Achieve Goals: Fair Additional Goals Additional Goal #1: Pt will score >19/24 on the DGI to indicate a reduced risk for falls    Frequency Min 3X/week     Co-evaluation               AM-PAC PT "6 Clicks" Mobility  Outcome Measure Help needed turning from your back to your side while in a flat bed without using bedrails?: A Little Help needed moving from lying on your back to sitting on the side of a flat bed without using bedrails?: A Little Help needed moving to and from a bed to a chair (including a wheelchair)?: A Little Help needed standing up from a chair using your arms (e.g., wheelchair or bedside chair)?: A Little Help needed to walk in hospital room?: A Little Help needed climbing 3-5 steps with a railing? : A Lot 6 Click Score: 17    End of Session   Activity Tolerance: Patient limited by lethargy Patient left: in bed;with call bell/phone within reach;with bed alarm set;with family/visitor present Nurse Communication: Mobility status PT Visit Diagnosis: Other abnormalities of gait and mobility (R26.89);Other symptoms and signs involving the nervous system (R29.898)    Time: 9147-8295 PT Time Calculation (min) (ACUTE ONLY): 28 min   Charges:   PT Evaluation $PT Eval Low Complexity: 1 Low          Arlyss Gandy, PT, DPT Acute Rehabilitation Office [phone]   Arlyss Gandy 11/20/2022, 10:59 AM

## 2022-11-21 DIAGNOSIS — R569 Unspecified convulsions: Secondary | ICD-10-CM | POA: Diagnosis not present

## 2022-11-21 DIAGNOSIS — R4182 Altered mental status, unspecified: Secondary | ICD-10-CM | POA: Diagnosis not present

## 2022-11-21 DIAGNOSIS — G9340 Encephalopathy, unspecified: Secondary | ICD-10-CM | POA: Diagnosis not present

## 2022-11-21 LAB — URINALYSIS, COMPLETE (UACMP) WITH MICROSCOPIC
Bacteria, UA: NONE SEEN
Bilirubin Urine: NEGATIVE
Glucose, UA: NEGATIVE mg/dL
Hgb urine dipstick: NEGATIVE
Ketones, ur: NEGATIVE mg/dL
Leukocytes,Ua: NEGATIVE
Nitrite: NEGATIVE
Protein, ur: NEGATIVE mg/dL
Specific Gravity, Urine: 1.011 (ref 1.005–1.030)
pH: 6 (ref 5.0–8.0)

## 2022-11-21 LAB — AMMONIA: Ammonia: 45 umol/L — ABNORMAL HIGH (ref 9–35)

## 2022-11-21 MED ORDER — SUMATRIPTAN SUCCINATE 50 MG PO TABS
50.0000 mg | ORAL_TABLET | ORAL | Status: DC | PRN
  Administered 2022-11-21 – 2022-11-22 (×4): 50 mg via ORAL
  Filled 2022-11-21 (×6): qty 1

## 2022-11-21 NOTE — Progress Notes (Addendum)
NEUROLOGY CONSULTATION PROGRESS NOTE   Date of service: November 21, 2022 Patient Name: Todd Roth MRN:  [id number] DOB:  05-02-1986 Interval Hx   Somnolence is somewhat improved. LTM EEG negative.  Vitals   Vitals:   11/20/22 0749 11/20/22 1448 11/20/22 1950 11/21/22 0749  BP: 108/67 124/72 133/84 108/72  Pulse: 79 74 81 (!) 58  Resp:   20   Temp: 98.3 F (36.8 C) 98.4 F (36.9 C) 98.5 F (36.9 C) (!) 97.4 F (36.3 C)  TempSrc: Oral Oral Oral Oral  SpO2: 96% 96% 96% 98%  Weight:      Height:         Body mass index is 31.8 kg/m.  Physical Exam   General: Laying comfortably in bed; in no acute distress.  HENT: Normal oropharynx and mucosa. Normal external appearance of ears and nose.  Neck: Supple, no pain or tenderness  CV: No JVD. No peripheral edema.  Pulmonary: Symmetric Chest rise. Normal respiratory effort.  Abdomen: Soft to touch, non-tender.  Ext: No cyanosis, edema, or deformity  Skin: No rash. Normal palpation of skin.   Musculoskeletal: Normal digits and nails by inspection. No clubbing.  Neurologic Examination  Mental status/Cognition: eyea partially open, oriented to self, place, month and year, good attention.  Speech/language: Fluent, comprehension intact, object naming intact, repetition intact.  Cranial nerves:   CN II Pupils equal and reactive to light, no VF deficits    CN III,IV,VI EOM intact, no gaze preference or deviation, no nystagmus    CN V normal sensation in V1, V2, and V3 segments bilaterally    CN VII no asymmetry, no nasolabial fold flattening    CN VIII normal hearing to speech    CN IX & X normal palatal elevation, no uvular deviation    CN XI 5/5 head turn and 5/5 shoulder shrug bilaterally    CN XII midline tongue protrusion    Motor:  Muscle bulk: normal, tone normal, pronator drift none tremor none Mvmt Root Nerve  Muscle Right Left Comments  SA C5/6 Ax Deltoid 5 5   EF C5/6 Mc Biceps 5 5   EE C6/7/8 Rad Triceps  5 5   WF C6/7 Med FCR     WE C7/8 PIN ECU     F Ab C8/T1 U ADM/FDI 5 5   HF L1/2/3 Fem Illopsoas 5 5   KE L2/3/4 Fem Quad 5 5   DF L4/5 D Peron Tib Ant 5 5   PF S1/2 Tibial Grc/Sol 5 5    Sensation:  Light touch intact   Pin prick    Temperature    Vibration   Proprioception    Coordination/Complex Motor:  - Finger to Nose intact BL - Heel to shin intact BL - Rapid alternating movement are normal - Gait: deferred  Labs   Basic Metabolic Panel:  Lab Results  Component Value Date   NA 137 11/20/2022   K 3.7 11/20/2022   CO2 25 11/20/2022   GLUCOSE 97 11/20/2022   BUN 9 11/20/2022   CREATININE 0.92 11/20/2022   CALCIUM 8.3 (L) 11/20/2022   GFRNONAA >60 11/20/2022   GFRAA >60 10/04/2015   HbA1c: No results found for: "HGBA1C" LDL: No results found for: "LDLCALC" Urine Drug Screen:     Component Value Date/Time   LABOPIA NONE DETECTED 11/19/2022 1903   COCAINSCRNUR NONE DETECTED 11/19/2022 1903   LABBENZ NONE DETECTED 11/19/2022 1903   AMPHETMU NONE DETECTED 11/19/2022 1903   THCU NONE  DETECTED 11/19/2022 1903   LABBARB NONE DETECTED 11/19/2022 1903    Alcohol Level     Component Value Date/Time   ETH <10 11/19/2022 1723   No results found for: "PHENYTOIN", "ZONISAMIDE", "LAMOTRIGINE", "LEVETIRACETA" No results found for: "PHENYTOIN", "PHENOBARB", "VALPROATE", "CBMZ"  Imaging and Diagnostic studies  CT Head without contrast(Personally reviewed): CTH was negative for a large hypodensity concerning for a large territory infarct or hyperdensity concerning for an ICH   CT angio Head and Neck with contrast(Personally reviewed): No LVO   MRI Brain(Personally reviewed): No acute abnormalities   cEEG:  normal Impression   Todd Roth is a 37 y.o. 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 Etiology unclear. No seizures, no other structural brian abnormalities noted on MRI. Possibly OSA but would  be odd given such acute presentation.  Recommendations  - need outpatient evaluation with sleep neurology. Follow up with Dr. Huston Foley with [facility]. - discont LTM - Lyrica at reduced dose of  BID. - Thiamine  daily PO at discharge. Also start Multivitamin daily. - we will signoff. Please feel free to contact us with any questions or concerns. ______________________________________________________________________  Plan discussed with patient and wife at bedside. Also discussed with Dr. Georgeann Oppenheim over secure chat.  Thank you for the opportunity to take part in the care of this patient. If you have any further questions, please contact the neurology consultation attending.  Signed,  Erick Blinks [facility] Pager Number [phone]

## 2022-11-21 NOTE — Progress Notes (Signed)
LTM maint complete - no skin breakdown replaced eeg's ecg leads. [facility] monitored, Event button test confirmed by [facility].

## 2022-11-21 NOTE — Plan of Care (Signed)

## 2022-11-21 NOTE — Progress Notes (Signed)
LTM EEG discontinued - no skin breakdown at unhook. [facility] notified 

## 2022-11-21 NOTE — Progress Notes (Signed)
PROGRESS NOTE    RAMEZ ARRONA  UJW:[id number] DOB: October 01, 1985 DOA: 11/19/2022 PCP: [facility]    Brief Narrative:   37 y.o. male with medical history significant for seizure disorder, who is admitted to [facility] on 11/19/2022 by way of transfer from [facility] emergency department with acute encephalopathy after presenting from home to [facility] ED complaining of altered mental status.    Family is noted the patient to be intermittently confused, somnolent over the course of the last 2 days.  They convey that the patient's mental status improves back to baseline, which the patient is able to carry out his ADLs at baseline capabilities, before experiencing additional altered mental status, confusion, somnolence.  This is not associated with any known tongue biting, tonic-clonic activity, or loss of bowel/bladder function.  No associate with any subjective fever, chills, rigors, or generalized myalgias.  No recent headache, neck stiffness, rash, cough, shortness of breath, abdominal pain, diarrhea, dysuria or gross hematuria.  No recent preceding trauma.  Patient denies any recreational drug use, or any recent/recurrent alcohol consumption.   He has a documented history of seizure disorder, but is not currently on any scheduled antiepileptic medications as an outpatient.  Outpatient medications notable for prn Flexeril as well as scheduled Mobic  Extensive CT and MRI imaging survey done at [facility] all reassuring. Case discussed with neurology.  Continuous EEG negative for epileptiform activity.  EEG monitoring has been discontinued.  Unclear nature of increased somnolence/AMS.  Per neurology will need close follow-up with sleep neurology in [place].  Urgent ambulatory referral Dr. Eliot Ford  Assessment & Plan:   Principal Problem:   Acute encephalopathy Active Problems:   Lactic acidosis   Altered mental status  Acute encephalopathy Somnolence, confusion,  altered mentation Rather acute onset.  Occurring over 2 days.  Transiently improved back to baseline.  Etiology is unclear.  No evidence of epileptiform discharge on continuous EEG.  No further recommendations from neurology.  Imaging study has been overall unrevealing.  There is a concern for central versus obstructive sleep apnea though time course speaks away from this. Plan: Patient was on high-dose Lyrica.  Dose has been reduced Check ammonia Check VBG Check carboxyhemoglobin Request therapy evaluations Possible discharge in 24 hours  History of TBI No acute issues  Lactic acidosis Unclear etiology.  Not consistent with seizures.  SIRS criteria not met.   DVT prophylaxis: SCD Code Status: Full Family Communication: Spouse at bedside 4/24 Disposition Plan: Status is: Inpatient Remains inpatient appropriate because: Somnolence, encephalopathy of unclear etiology   Level of care: Progressive  Consultants:  Neurology, signed off  Procedures:  Continuous EEG  Antimicrobials: None   Subjective: Seen and examined.  Patient somnolent.  Wife at bedside.  Objective: Vitals:   11/20/22 1448 11/20/22 1950 11/21/22 0749 11/21/22 1400  BP: 124/72 133/84 108/72 131/81  Pulse: 74 81 (!) 58 76  Resp:  20  16  Temp: 98.4 F (36.9 C) 98.5 F (36.9 C) (!) 97.4 F (36.3 C) 97.8 F (36.6 C)  TempSrc: Oral Oral Oral Oral  SpO2: 96% 96% 98% 93%  Weight:      Height:        Intake/Output Summary (Last 24 hours) at 11/21/2022 1528 Last data filed at 11/21/2022 1223 Gross per 24 hour  Intake 1300 ml  Output 3000 ml  Net -1700 ml   Filed Weights   11/19/22 1717  Weight: 92.1 kg    Examination:  General exam: Somnolent.  He  is mentating.  Able to follow simple commands. Respiratory system: Clear to auscultation. Respiratory effort normal. Cardiovascular system: S1-S2, RRR, no murmurs, no pedal edema Gastrointestinal system: Soft, nontender, nondistended, normal bowel  sounds Central nervous system: Lethargic.  Unable to assess orientation.  No focal deficits Extremities: Decreased power symmetrically Skin: No rashes, lesions or ulcers Psychiatry: Unable to assess    Data Reviewed: I have personally reviewed following labs and imaging studies  CBC: Recent Labs  Lab 11/18/22 0040 11/18/22 0317 11/18/22 0318 11/19/22 1722 11/19/22 1915 11/20/22 0119  WBC 7.9  --   --  6.5  --  5.4  NEUTROABS 4.5  --   --  3.8  --  2.9  HGB 12.1* 12.9* 13.3 14.0 12.9* 12.0*  HCT 37.0* 38.0* 39.0 43.1 38.0* 35.9*  MCV 86.0  --   --  87.1  --  85.3  PLT 173  --   --  191  --  154   Basic Metabolic Panel: Recent Labs  Lab 11/18/22 0040 11/18/22 0317 11/18/22 0318 11/19/22 1722 11/19/22 1915 11/20/22 0119  NA 139 140 140 140 139 137  K 3.2* 3.6 3.6 3.6 3.6 3.7  CL 102 104  --  104  --  106  CO2 25  --   --  26  --  25  GLUCOSE 99 99  --  92  --  97  BUN 9 10  --  13  --  9  CREATININE 1.11 0.90  --  1.02  --  0.92  CALCIUM 9.1  --   --  9.4  --  8.3*  MG  --   --   --   --   --  2.0   GFR: Estimated Creatinine Clearance: 121.3 mL/min (by C-G formula based on SCr of 0.92 mg/dL). Liver Function Tests: Recent Labs  Lab 11/18/22 0040 11/19/22 1722 11/20/22 0119  AST 40 25 23  ALT 33 25 24  ALKPHOS 69 71 67  BILITOT 0.8 0.6 0.7  PROT 7.1 7.6 6.3*  ALBUMIN 4.0 4.4 3.4*   No results for input(s): "LIPASE", "AMYLASE" in the last 168 hours. Recent Labs  Lab 11/19/22 1723 11/21/22 1051  AMMONIA 38* 45*   Coagulation Profile: Recent Labs  Lab 11/20/22 0349  INR 1.1   Cardiac Enzymes: Recent Labs  Lab 11/20/22 0138  CKTOTAL 168   BNP (last 3 results) No results for input(s): "PROBNP" in the last 8760 hours. HbA1C: No results for input(s): "HGBA1C" in the last 72 hours. CBG: Recent Labs  Lab 11/19/22 1712  GLUCAP 116*   Lipid Profile: No results for input(s): "CHOL", "HDL", "LDLCALC", "TRIG", "CHOLHDL", "LDLDIRECT" in the last  72 hours. Thyroid Function Tests: Recent Labs    11/19/22 1820  TSH 1.052  FREET4 0.75   Anemia Panel: Recent Labs    11/20/22 0349 11/20/22 1005  VITAMINB12 379  --   FOLATE  --  8.3   Sepsis Labs: Recent Labs  Lab 11/19/22 1723 11/19/22 1930 11/20/22 0132  LATICACIDVEN 2.1* 3.0* 1.0    No results found for this or any previous visit (from the past 240 hour(s)).       Radiology Studies: Overnight EEG with video  Result Date: 11/21/2022 Charlsie Quest, MD     11/21/2022 10:24 AM Patient Name: Todd Roth MRN: [id number] Epilepsy Attending: Charlsie Quest Referring Physician/Provider: Erick Blinks, MD Duration: 11/20/2022 1523 to 11/21/2022 1020 Patient history:  37 y.o. male with PMH significant  for TBI in 2022 from fall from a ladder who presents with somnolence. EEG to evaluate for seizure. Level of alertness: Awake, asleep AEDs during EEG study: None Technical aspects: This EEG study was done with scalp electrodes positioned according to the 10-20 International system of electrode placement. Electrical activity was reviewed with band pass filter of 1-70Hz , sensitivity of 7 uV/mm, display speed of 53mm/sec with a 60Hz  notched filter applied as appropriate. EEG data were recorded continuously and digitally stored.  Video monitoring was available and reviewed as appropriate. Description: The posterior dominant rhythm consists of 10-11 Hz activity of moderate voltage (25-35 uV) seen predominantly in posterior head regions, symmetric and reactive to eye opening and eye closing. Sleep was characterized by vertex waves, sleep spindles (12 to 14 Hz), maximal frontocentral region. Photic stimulation and hyperventilation were not performed.   Event button was pressed on 11/21/2022 at 1001. Per family, patient's eyes were rolling back.  Concomitant EEG before, during and after the event did not show any EEG to suggest seizure.  IMPRESSION: This study is within normal  limits. No seizures or epileptiform discharges were seen throughout the recording.  Event button was pressed on 11/21/2022 at 1001.  Per family, patient's eyes were rolling back without concomitant EEG change.  This was not an epileptic event.  Charlsie Quest   EEG adult  Result Date: 11/20/2022 Charlsie Quest, MD     11/20/2022  8:47 AM Patient Name: KYLIE GROS MRN: [id number] Epilepsy Attending: Charlsie Quest Referring Physician/Provider: Angie Fava, DO Date: 11/20/2022 Duration: 26.44 mins Patient history: 37yo M with with ams getting eeg to evaluate for seizure. Level of alertness: Awake, asleep AEDs during EEG study: None Technical aspects: This EEG study was done with scalp electrodes positioned according to the 10-20 International system of electrode placement. Electrical activity was reviewed with band pass filter of 1-70Hz , sensitivity of 7 uV/mm, display speed of 42mm/sec with a 60Hz  notched filter applied as appropriate. EEG data were recorded continuously and digitally stored.  Video monitoring was available and reviewed as appropriate. Description: The posterior dominant rhythm consists of 10-11 Hz activity of moderate voltage (25-35 uV) seen predominantly in posterior head regions, symmetric and reactive to eye opening and eye closing. Sleep was characterized by vertex waves, sleep spindles (12 to 14 Hz), maximal frontocentral region. Physiologic photic driving was seen during photic stimulation. Hyperventilation was not performed.   IMPRESSION: This study is within normal limits. No seizures or epileptiform discharges were seen throughout the recording. A normal interictal EEG does not exclude the diagnosis of epilepsy. Charlsie Quest   CT Head Wo Contrast  Result Date: 11/19/2022 CLINICAL DATA:  Altered mental status. EXAM: CT HEAD WITHOUT CONTRAST TECHNIQUE: Contiguous axial images were obtained from the base of the skull through the vertex without intravenous  contrast. RADIATION DOSE REDUCTION: This exam was performed according to the departmental dose-optimization program which includes automated exposure control, adjustment of the mA and/or kV according to patient size and/or use of iterative reconstruction technique. COMPARISON:  11/18/2022 FINDINGS: Brain: No evidence of acute infarction, hemorrhage, hydrocephalus, extra-axial collection or mass lesion/mass effect. Vascular: No hyperdense vessel or unexpected calcification. Skull: Normal. Negative for fracture or focal lesion. Sinuses/Orbits: No acute finding. Other: None. IMPRESSION: No acute intracranial pathology. Electronically Signed   By: Signa Kell M.D.   On: 11/19/2022 18:11        Scheduled Meds:  pregabalin  150 mg Oral BID   thiamine (VITAMIN B1)  injection  100 mg Intravenous Daily   Or   thiamine  100 mg Oral Daily   Continuous Infusions:  sodium chloride 125 mL/hr at 11/21/22 0606     LOS: 1 day      Tresa Moore, MD [facility]   If 7PM-7AM, please contact night-coverage  11/21/2022, 3:28 PM

## 2022-11-21 NOTE — Progress Notes (Signed)
Pt drowsy and oriented to self, place, and time. Transferred to higher level of care for closer monitoring and more frequent assessments.

## 2022-11-21 NOTE — Progress Notes (Signed)
Electrode maintenance complete. No skin breakdown at checked electrode sites. 

## 2022-11-21 NOTE — Procedures (Addendum)
Patient Name: Todd Roth  MRN: [id number]  Epilepsy Attending: Charlsie Quest  Referring Physician/Provider: Erick Blinks, MD  Duration: 11/20/2022 1523 to 11/21/2022 1331  Patient history:  37 y.o. 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 EEG to evaluate for seizure.  Level of alertness: Awake, asleep  AEDs during EEG study: None  Technical aspects: This EEG study was done with scalp electrodes positioned according to the 10-20 International system of electrode placement. Electrical activity was reviewed with band pass filter of 1-70Hz , sensitivity of 7 uV/mm, display speed of 43mm/sec with a 60Hz  notched filter applied as appropriate. EEG data were recorded continuously and digitally stored.  Video monitoring was available and reviewed as appropriate.  Description: The posterior dominant rhythm consists of 10-11 Hz activity of moderate voltage (25-35 uV) seen predominantly in posterior head regions, symmetric and reactive to eye opening and eye closing. Sleep was characterized by vertex waves, sleep spindles (12 to 14 Hz), maximal frontocentral region. Photic stimulation and hyperventilation were not performed.     Event button was pressed on 11/21/2022 at 1001 and 1204. Per family, patient's eyes were rolling back.  Concomitant EEG before, during and after the event did not show any EEG to suggest seizure.   IMPRESSION: This study is within normal limits. No seizures or epileptiform discharges were seen throughout the recording.   Event button was pressed on 11/21/2022 at 1001 and 1204 for eyes were rolling back without concomitant EEG change.  This was not an epileptic event.   Mirta Mally Annabelle Harman

## 2022-11-21 NOTE — Progress Notes (Signed)
PT Cancellation Note  Patient Details Name: Todd Roth MRN: [id number] DOB: 06-25-1986   Cancelled Treatment:    Reason Eval/Treat Not Completed: (P) Patient at procedure or test/unavailable (Pt off of the floor for a procedure. Will follow up as schedule allows.)   Johny Shock 11/21/2022, 2:51 PM

## 2022-11-22 DIAGNOSIS — G9340 Encephalopathy, unspecified: Secondary | ICD-10-CM | POA: Diagnosis not present

## 2022-11-22 LAB — VITAMIN B1: Vitamin B1 (Thiamine): 108.2 nmol/L (ref 66.5–200.0)

## 2022-11-22 MED ORDER — LACTULOSE 10 GM/15ML PO SOLN
30.0000 g | Freq: Two times a day (BID) | ORAL | Status: DC
  Administered 2022-11-22 – 2022-11-23 (×3): 30 g via ORAL
  Filled 2022-11-22 (×3): qty 45

## 2022-11-22 MED ORDER — ADULT MULTIVITAMIN W/MINERALS CH
1.0000 | ORAL_TABLET | Freq: Every day | ORAL | Status: DC
  Administered 2022-11-22 – 2022-11-23 (×2): 1 via ORAL
  Filled 2022-11-22 (×2): qty 1

## 2022-11-22 MED ORDER — VITAMIN B-12 1000 MCG PO TABS
1000.0000 ug | ORAL_TABLET | Freq: Every day | ORAL | Status: DC
  Administered 2022-11-22 – 2022-11-23 (×2): 1000 ug via ORAL
  Filled 2022-11-22 (×2): qty 1

## 2022-11-22 NOTE — Progress Notes (Signed)
Progress Note    Todd Roth   WUJ:[id number]  DOB: [birth year]  DOA: 11/19/2022     2 PCP: [facility]  Initial CC: encephalopathy   Hospital Course: Todd Roth is a 37 y.o. male with PMH TBI 2022 who presented with encephalopathy and weakness.  He was noted to be intermittently confused and somnolent of her 2 days leading up to hospitalization.  Typically he is able to carry out ADLs at home and ambulates with no difficulty. Although having a documented history of seizure disorder, his wife denied this when asked. He underwent neurology evaluation and EEG testing which was negative for seizure activity.  He was recommended to follow-up outpatient with sleep neurology.  Interval History:  No events overnight.  Wife laying in bed with patient this morning.  He was very somnolent and unable to open his eyes although could mumble answers to a few questions.  Followed some commands intermittently although effort seemed poor in general.  Assessment and Plan:  Acute encephalopathy Somnolence, confusion, altered mentation Rather acute onset.  Occurring over 2 days.  Transiently improved back to baseline.  Etiology is unclear.  No evidence of epileptiform discharge on continuous EEG.  No further recommendations from neurology.  Imaging study has been overall unrevealing.  There is a concern for central versus obstructive sleep apnea though time course speaks away from this. -Other considered differential includes underlying psych disorder - Given mildly elevated ammonia, will start on lactulose to see if any improvement - Also holding any sedating medications.  Reviewed pharmacy list, he is on amitriptyline 75 mg total daily at home along with Lyrica; recently prescribed short 10-day course of Flexeril as well - Continue Lyrica at lower dosing - Holding amitriptyline -Continue working with PT   History of TBI -[place] Seat approximately 9 feet from a roof in 2022 No acute  issues   Lactic acidosis Unclear etiology.  Not consistent with seizures.  SIRS criteria not met.  Old records reviewed in assessment of this patient  Antimicrobials:   DVT prophylaxis:  SCDs Start: 11/20/22 0108   Code Status:   Code Status: Full Code  Mobility Assessment (last 72 hours)     Mobility Assessment     Row Name 11/22/22 1336 11/22/22 0742 11/21/22 1934 11/21/22 1410 11/21/22 0745   Does patient have an order for bedrest or is patient medically unstable -- No - Continue assessment No - Continue assessment No - Continue assessment Yes- Bedfast (Level 1) - Complete   What is the highest level of mobility based on the progressive mobility assessment? Level 5 (Walks with assist in room/hall) - Balance while stepping forward/back and can walk in room with assist - Complete Level 5 (Walks with assist in room/hall) - Balance while stepping forward/back and can walk in room with assist - Complete Level 2 (Chairfast) - Balance while sitting on edge of bed and cannot stand Level 2 (Chairfast) - Balance while sitting on edge of bed and cannot stand Level 1 (Bedfast) - Unable to balance while sitting on edge of bed   Is the above level different from baseline mobility prior to current illness? -- Yes - Recommend PT order Yes - Recommend PT order Yes - Recommend PT order Yes - Recommend PT order    Row Name 11/20/22 2035 11/20/22 1039         Does patient have an order for bedrest or is patient medically unstable No - Continue assessment --  What is the highest level of mobility based on the progressive mobility assessment? Level 5 (Walks with assist in room/hall) - Balance while stepping forward/back and can walk in room with assist - Complete Level 5 (Walks with assist in room/hall) - Balance while stepping forward/back and can walk in room with assist - Complete               Barriers to discharge:  Disposition Plan:  Home Status is: Inpt  Objective: Blood pressure  (!) 143/87, pulse 72, temperature 97.7 F (36.5 C), temperature source Oral, resp. rate 18, height  (1.702 m), weight 92.1 kg, SpO2 94 %.  Examination:  Physical Exam Constitutional:      General: He is not in acute distress.    Comments: Strongly somnolent appearing and unable to even open eyes to interact.  Answers a few questions with mumbling  HENT:     Head: Normocephalic and atraumatic.     Mouth/Throat:     Mouth: Mucous membranes are moist.  Eyes:     Pupils: Pupils are equal, round, and reactive to light.  Cardiovascular:     Rate and Rhythm: Normal rate and regular rhythm.     Heart sounds: Normal heart sounds.  Pulmonary:     Effort: Pulmonary effort is normal. No respiratory distress.     Breath sounds: Normal breath sounds. No wheezing.  Abdominal:     General: Bowel sounds are normal. There is no distension.     Palpations: Abdomen is soft.     Tenderness: There is no abdominal tenderness.  Musculoskeletal:        General: Normal range of motion.     Cervical back: Normal range of motion and neck supple.  Skin:    General: Skin is warm and dry.  Neurological:     Comments: Poor effort; unable to fully assess; moves all 4 extremities       Consultants:    Procedures:    Data Reviewed: No results found for this or any previous visit (from the past 24 hour(s)).  I have reviewed pertinent nursing notes, vitals, labs, and images as necessary. I have ordered labwork to follow up on as indicated.  I have reviewed the last notes from staff over past 24 hours. I have discussed patient's care plan and test results with nursing staff, CM/SW, and other staff as appropriate.  Time spent: Greater than 50% of the 55 minute visit was spent in counseling/coordination of care for the patient as laid out in the A&P.   LOS: 2 days   Lewie Chamber, MD [facility] 11/22/2022, 3:22 PM

## 2022-11-22 NOTE — Hospital Course (Addendum)
Todd Roth is a 37 y.o. male with PMH TBI 2022 who presented with encephalopathy and weakness.  He was noted to be intermittently confused and somnolent of her 2 days leading up to hospitalization.  Typically he is able to carry out ADLs at home and ambulates with no difficulty. Although having a documented history of seizure disorder, his wife denied this when asked. He underwent neurology evaluation and EEG testing which was negative for seizure activity.  He was recommended to follow-up outpatient with sleep neurology.

## 2022-11-22 NOTE — Progress Notes (Signed)
Physical Therapy Treatment Patient Details Name: Todd Roth MRN: [id number] DOB: [birth year] Today's Date: 11/22/2022   History of Present Illness 37 y.o. male presents to [facility] hospital on 11/19/2022 with AMS. EEG negative. PMH includes TBI and seizure disorder, however spouse denies any history of seizures.    PT Comments    Pt able to amb fairly well when awake and alert. As amb distance incr pt with incr eye closing and decr alertness. Able to make it back to bed. Expect if arousal/cognition improve that pt's mobility will also improve.    Recommendations for follow up therapy are one component of a multi-disciplinary discharge planning process, led by the attending physician.  Recommendations may be updated based on patient status, additional functional criteria and insurance authorization.  Follow Up Recommendations       Assistance Recommended at Discharge Frequent or constant Supervision/Assistance  Patient can return home with the following A little help with walking and/or transfers;A lot of help with bathing/dressing/bathroom;Assistance with cooking/housework;Assistance with feeding;Direct supervision/assist for medications management;Direct supervision/assist for financial management;Assist for transportation;Help with stairs or ramp for entrance   Equipment Recommendations  None recommended by PT    Recommendations for Other Services       Precautions / Restrictions Precautions Precautions: Fall Restrictions Weight Bearing Restrictions: No     Mobility  Bed Mobility Overal bed mobility: Needs Assistance Bed Mobility: Sit to Supine       Sit to supine: Min guard   General bed mobility comments: Cues to initiate    Transfers Overall transfer level: Needs assistance Equipment used: None Transfers: Sit to/from Stand Sit to Stand: Min guard, Supervision           General transfer comment: Assist for safety    Ambulation/Gait Ambulation/Gait  assistance: Min guard, Min assist Gait Distance (Feet): 250 Feet Assistive device: None Gait Pattern/deviations: Step-through pattern, Drifts right/left Gait velocity: reduced Gait velocity interpretation: <1.31 ft/sec, indicative of household ambulator   General Gait Details: Assist for safety and one knee with slight give away as pt appeared to be drifting to sleep requiring min assist. As distance incr pt with incr eye closing and slowing of responses   Stairs             Wheelchair Mobility    Modified Rankin (Stroke Patients Only)       Balance Overall balance assessment: Needs assistance Sitting-balance support: No upper extremity supported, Feet supported Sitting balance-Leahy Scale: Good     Standing balance support: No upper extremity supported, During functional activity Standing balance-Leahy Scale: Fair                              Cognition Arousal/Alertness: Awake/alert Behavior During Therapy: Flat affect Overall Cognitive Status: Impaired/Different from baseline Area of Impairment: Attention, Following commands, Safety/judgement, Awareness, Problem solving                   Current Attention Level: Sustained   Following Commands: Follows multi-step commands inconsistently, Follows one step commands with increased time Safety/Judgement: Decreased awareness of safety, Decreased awareness of deficits Awareness: Intellectual Problem Solving: Slow processing, Requires verbal cues          Exercises      General Comments        Pertinent Vitals/Pain Pain Assessment Pain Assessment: Faces Faces Pain Scale: Hurts little more Pain Location: head Pain Descriptors / Indicators: Headache    Home Living  Prior Function            PT Goals (current goals can now be found in the care plan section) Acute Rehab PT Goals Patient Stated Goal: to return to baseline Progress towards PT goals:  Progressing toward goals    Frequency    Min 3X/week      PT Plan Current plan remains appropriate    Co-evaluation              AM-PAC PT "6 Clicks" Mobility   Outcome Measure  Help needed turning from your back to your side while in a flat bed without using bedrails?: A Little Help needed moving from lying on your back to sitting on the side of a flat bed without using bedrails?: A Little Help needed moving to and from a bed to a chair (including a wheelchair)?: A Little Help needed standing up from a chair using your arms (e.g., wheelchair or bedside chair)?: A Little Help needed to walk in hospital room?: A Little Help needed climbing 3-5 steps with a railing? : A Lot 6 Click Score: 17    End of Session   Activity Tolerance: Patient limited by lethargy Patient left: in bed;with call bell/phone within reach;with bed alarm set;with family/visitor present Nurse Communication: Mobility status PT Visit Diagnosis: Other abnormalities of gait and mobility (R26.89);Other symptoms and signs involving the nervous system (R29.898)     Time: 1610-9604 PT Time Calculation (min) (ACUTE ONLY): 12 min  Charges:  $Gait Training: 8-22 mins                     [facility] PT Acute Rehabilitation Services Office [phone]    Angelina Ok [facility] 11/22/2022, 1:42 PM

## 2022-11-23 DIAGNOSIS — R4182 Altered mental status, unspecified: Secondary | ICD-10-CM | POA: Diagnosis not present

## 2022-11-23 DIAGNOSIS — G9340 Encephalopathy, unspecified: Secondary | ICD-10-CM | POA: Diagnosis not present

## 2022-11-23 LAB — BASIC METABOLIC PANEL
Anion gap: 9 (ref 5–15)
BUN: 8 mg/dL (ref 6–20)
CO2: 26 mmol/L (ref 22–32)
Calcium: 9 mg/dL (ref 8.9–10.3)
Chloride: 103 mmol/L (ref 98–111)
Creatinine, Ser: 1.01 mg/dL (ref 0.61–1.24)
GFR, Estimated: >60 mL/min (ref >60)
Glucose, Bld: 99 mg/dL (ref 70–99)
Potassium: 4.1 mmol/L (ref 3.5–5.1)
Sodium: 138 mmol/L (ref 135–145)

## 2022-11-23 LAB — CBC WITH DIFFERENTIAL/PLATELET
Abs Immature Granulocytes: 0.01 10*3/uL (ref 0.00–0.07)
Basophils Absolute: 0 10*3/uL (ref 0.0–0.1)
Basophils Relative: 1 %
Eosinophils Absolute: 0.3 10*3/uL (ref 0.0–0.5)
Eosinophils Relative: 5 %
HCT: 38.3 % — ABNORMAL LOW (ref 39.0–52.0)
Hemoglobin: 12.6 g/dL — ABNORMAL LOW (ref 13.0–17.0)
Immature Granulocytes: 0 %
Lymphocytes Relative: 33 %
Lymphs Abs: 1.9 10*3/uL (ref 0.7–4.0)
MCH: 28.4 pg (ref 26.0–34.0)
MCHC: 32.9 g/dL (ref 30.0–36.0)
MCV: 86.3 fL (ref 80.0–100.0)
Monocytes Absolute: 0.5 10*3/uL (ref 0.1–1.0)
Monocytes Relative: 9 %
Neutro Abs: 3 10*3/uL (ref 1.7–7.7)
Neutrophils Relative %: 52 %
Platelets: 155 10*3/uL (ref 150–400)
RBC: 4.44 MIL/uL (ref 4.22–5.81)
RDW: 14.2 % (ref 11.5–15.5)
WBC: 5.7 10*3/uL (ref 4.0–10.5)
nRBC: 0 % (ref 0.0–0.2)

## 2022-11-23 LAB — MAGNESIUM: Magnesium: 1.9 mg/dL (ref 1.7–2.4)

## 2022-11-23 LAB — AMMONIA: Ammonia: 20 umol/L (ref 9–35)

## 2022-11-23 MED ORDER — CYANOCOBALAMIN 1000 MCG PO TABS: 1000.0000 ug | ORAL_TABLET | Freq: Every day | ORAL | Status: DC

## 2022-11-23 MED ORDER — PREGABALIN 150 MG PO CAPS: 150.0000 mg | ORAL_CAPSULE | Freq: Two times a day (BID) | ORAL | 0 refills | Status: DC

## 2022-11-23 MED ORDER — THIAMINE HCL 100 MG PO TABS: 100.0000 mg | ORAL_TABLET | Freq: Every day | ORAL | Status: AC

## 2022-11-23 MED ORDER — ADULT MULTIVITAMIN W/MINERALS CH: 1.0000 | ORAL_TABLET | Freq: Every day | ORAL | Status: AC

## 2022-11-23 MED ORDER — AMITRIPTYLINE HCL 25 MG PO TABS: 25.0000 mg | ORAL_TABLET | Freq: Every day | ORAL | Status: DC

## 2022-11-23 NOTE — Progress Notes (Signed)
Mobility Specialist Progress Note   11/23/22 1231  Mobility  Activity Ambulated independently in hallway  Level of Assistance Independent after set-up  Assistive Device None  Distance Ambulated (ft) 500 ft  Activity Response Tolerated well  Mobility Referral Yes  $Mobility charge 1 Mobility   Patient received at sink agreeable to participate in mobility. Ambulated in hallway independently having mild sway in gait but no faults throughout. Returned to room without complaint, left standing in room with all needs met and wife by side.   Frederico Hamman Mobility Specialist Please contact via SecureChat or  Rehab office at [phone]

## 2022-11-23 NOTE — Discharge Summary (Addendum)
Physician Discharge Summary   Todd Roth ZOX:[id number] DOB: 03-03-1986 DOA: 11/19/2022  PCP: [facility]  Admit date: 11/19/2022 Discharge date: 11/23/2022   Admitted From: Home Disposition:  Home Discharging physician: Lewie Chamber, MD Barriers to discharge: none  Recommendations at discharge: [facility] Discuss change in migraine meds Decrease lyrica to 150 mg BID per neuro recs Decrease amitriptyline to 25 mg daily or discontinue all together  Follow up with neurology for sleep study  Discharge Condition: stable CODE STATUS: Full Diet recommendation:  Diet Orders (From admission, onward)     Start     Ordered   11/23/22 0000  Diet general        11/23/22 1218   11/20/22 0109  Diet regular Room service appropriate? Yes; Fluid consistency: Thin  Diet effective now       Question Answer Comment  Room service appropriate? Yes   Fluid consistency: Thin      11/20/22 0108            Hospital Course: Todd Roth is a 37 y.o. male with PMH TBI 2022 who presented with encephalopathy and weakness.  He was noted to be intermittently confused and somnolent of her 2 days leading up to hospitalization.  Typically he is able to carry out ADLs at home and ambulates with no difficulty. Although having a documented history of seizure disorder, his wife denied this when asked. He underwent neurology evaluation and EEG testing which was negative for seizure activity.  He was recommended to follow-up outpatient with sleep neurology.  Assessment and Plan:  Acute encephalopathy Somnolence, confusion, altered mentation Rather acute onset.  Occurring over 2 days.  Transiently improved back to baseline.  Etiology is unclear.  No evidence of epileptiform discharge on continuous EEG.  No further recommendations from neurology.  Imaging study has been overall unrevealing.  There is a concern for central versus obstructive sleep apnea though time course speaks away  from this. -Other considered differential includes underlying psych disorder -Hyperammonemia responded to lactulose.  Unclear etiology as to cause.  Ammonia normalized at discharge -Sedating medications held on admission.  Discussed regimen with pharmacy as well.  He is recommended to reduce amitriptyline to 25 mg daily.  Neurology also recommending Lyrica decrease to 150 mg twice daily - Caution with any further sedating and psychotropic medications - Patient [facility] at [facility] -Patient was awake, alert, and ambulating well with mobility specialist prior to discharge   History of TBI -Todd Roth approximately 9 feet from a roof in 2022 No acute issues   Lactic acidosis Unclear etiology.  Not consistent with seizures.  SIRS criteria not met.   The patient's chronic medical conditions were treated accordingly per the patient's home medication regimen except as noted.  On day of discharge, patient was felt deemed stable for discharge. Patient/family member advised to call PCP or come back to ER if needed.   Principal Diagnosis: Acute encephalopathy  Discharge Diagnoses: Active Hospital Problems  No active problems to display.    Resolved Hospital Problems   Diagnosis Date Noted Date Resolved   Acute encephalopathy 11/20/2022 11/23/2022   Lactic acidosis 11/20/2022 11/23/2022   Altered mental status 11/20/2022 11/23/2022     Discharge Instructions     Ambulatory referral to Neurology   Complete by: As directed    An appointment is requested in approximately: 1 week.  Suspect possible underlying central sleep apnea   Ambulatory referral to Physical Therapy  Complete by: As directed    Diet general   Complete by: As directed    Increase activity slowly   Complete by: As directed       Allergies as of 11/23/2022       Reactions   Metronidazole Diarrhea        Medication List     STOP taking these medications    cyclobenzaprine 10  MG tablet Commonly known as: FLEXERIL   meloxicam 7.5 MG tablet Commonly known as: Mobic   propranolol 40 MG tablet Commonly known as: INDERAL       TAKE these medications    amitriptyline 25 MG tablet Commonly known as: ELAVIL Take 1 tablet (25 mg total) by mouth at bedtime. What changed: how much to take   cyanocobalamin 1000 MCG tablet Take 1 tablet (1,000 mcg total) by mouth daily. Start taking on: November 24, 2022   multivitamin with minerals Tabs tablet Take 1 tablet by mouth daily. Start taking on: November 24, 2022   pantoprazole 20 MG tablet Commonly known as: PROTONIX Take 20 mg by mouth 2 (two) times daily before a meal.   pregabalin 150 MG capsule Commonly known as: LYRICA Take 1 capsule (150 mg total) by mouth 2 (two) times daily. What changed:  medication strength how much to take   SUMAtriptan 100 MG tablet Commonly known as: IMITREX Take 100 mg by mouth every 2 (two) hours as needed for migraine. May repeat in 2 hours if headache persists or recurs.   thiamine 100 MG tablet Commonly known as: VITAMIN B1 Take 1 tablet (100 mg total) by mouth daily.        Follow-up Information     [facility] Follow up.   [facility] Contact information: Address: [address], [place], [place] [address] Phone: [phone]               Allergies  Allergen Reactions   Metronidazole Diarrhea    Consultations: Neurology   Procedures:   Discharge Exam: BP 106/72 (BP Location: Right Arm)   Pulse 61   Temp 98.5 F (36.9 C)   Resp 17   Ht 5\' 7"  (1.702 m)   Wt 92.1 kg   SpO2 98%   BMI 31.80 kg/m  Physical Exam Constitutional:      General: He is not in acute distress.    Comments: Much more awake and alert.  Sitting on edge of bed with wife present bedside.  HENT:     Head: Normocephalic and atraumatic.     Mouth/Throat:     Mouth: Mucous membranes are moist.  Eyes:     Extraocular  Movements: Extraocular movements intact.     Comments: Disconjugate gaze  Cardiovascular:     Rate and Rhythm: Normal rate and regular rhythm.     Heart sounds: Normal heart sounds.  Pulmonary:     Effort: Pulmonary effort is normal. No respiratory distress.     Breath sounds: Normal breath sounds. No wheezing.  Abdominal:     General: Bowel sounds are normal. There is no distension.     Palpations: Abdomen is soft.     Tenderness: There is no abdominal tenderness.  Musculoskeletal:        General: Normal range of motion.     Cervical back: Normal range of motion and neck supple.  Skin:    General: Skin is warm and dry.  Neurological:     General:  No focal deficit present.  Psychiatric:        Mood and Affect: Mood normal.      The results of significant diagnostics from this hospitalization (including imaging, microbiology, ancillary and laboratory) are listed below for reference.   Microbiology: No results found for this or any previous visit (from the past 240 hour(s)).   Labs: BNP (last 3 results) No results for input(s): "BNP" in the last 8760 hours. Basic Metabolic Panel: Recent Labs  Lab 11/18/22 0040 11/18/22 0317 11/18/22 0318 11/19/22 1722 11/19/22 1915 11/20/22 0119 11/23/22 0730  NA 139 140 140 140 139 137 138  K 3.2* 3.6 3.6 3.6 3.6 3.7 4.1  CL 102 104  --  104  --  106 103  CO2 25  --   --  26  --  25 26  GLUCOSE 99 99  --  92  --  97 99  BUN 9 10  --  13  --  9 8  CREATININE 1.11 0.90  --  1.02  --  0.92 1.01  CALCIUM 9.1  --   --  9.4  --  8.3* 9.0  MG  --   --   --   --   --  2.0 1.9   Liver Function Tests: Recent Labs  Lab 11/18/22 0040 11/19/22 1722 11/20/22 0119  AST 40 25 23  ALT 33 25 24  ALKPHOS 69 71 67  BILITOT 0.8 0.6 0.7  PROT 7.1 7.6 6.3*  ALBUMIN 4.0 4.4 3.4*   No results for input(s): "LIPASE", "AMYLASE" in the last 168 hours. Recent Labs  Lab 11/19/22 1723 11/21/22 1051 11/23/22 0730  AMMONIA 38* 45* 20    CBC: Recent Labs  Lab 11/18/22 0040 11/18/22 0317 11/18/22 0318 11/19/22 1722 11/19/22 1915 11/20/22 0119 11/23/22 0730  WBC 7.9  --   --  6.5  --  5.4 5.7  NEUTROABS 4.5  --   --  3.8  --  2.9 3.0  HGB 12.1*   < > 13.3 14.0 12.9* 12.0* 12.6*  HCT 37.0*   < > 39.0 43.1 38.0* 35.9* 38.3*  MCV 86.0  --   --  87.1  --  85.3 86.3  PLT 173  --   --  191  --  154 155   < > = values in this interval not displayed.   Cardiac Enzymes: Recent Labs  Lab 11/20/22 0138  CKTOTAL 168   BNP: Invalid input(s): "POCBNP" CBG: Recent Labs  Lab 11/19/22 1712  GLUCAP 116*   D-Dimer No results for input(s): "DDIMER" in the last 72 hours. Hgb A1c No results for input(s): "HGBA1C" in the last 72 hours. Lipid Profile No results for input(s): "CHOL", "HDL", "LDLCALC", "TRIG", "CHOLHDL", "LDLDIRECT" in the last 72 hours. Thyroid function studies No results for input(s): "TSH", "T4TOTAL", "T3FREE", "THYROIDAB" in the last 72 hours.  Invalid input(s): "FREET3" Anemia work up No results for input(s): "VITAMINB12", "FOLATE", "FERRITIN", "TIBC", "IRON", "RETICCTPCT" in the last 72 hours. Urinalysis    Component Value Date/Time   COLORURINE STRAW (A) 11/21/2022 1017   APPEARANCEUR CLEAR 11/21/2022 1017   LABSPEC 1.011 11/21/2022 1017   PHURINE 6.0 11/21/2022 1017   GLUCOSEU NEGATIVE 11/21/2022 1017   HGBUR NEGATIVE 11/21/2022 1017   BILIRUBINUR NEGATIVE 11/21/2022 1017   KETONESUR NEGATIVE 11/21/2022 1017   PROTEINUR NEGATIVE 11/21/2022 1017   NITRITE NEGATIVE 11/21/2022 1017   LEUKOCYTESUR NEGATIVE 11/21/2022 1017   Sepsis Labs Recent Labs  Lab 11/18/22 0040 11/19/22  1722 11/20/22 0119 11/23/22 0730  WBC 7.9 6.5 5.4 5.7   Microbiology No results found for this or any previous visit (from the past 240 hour(s)).  Procedures/Studies: Overnight EEG with video  Result Date: 11/21/2022 Charlsie Quest, MD     11/22/2022 11:21 AM Patient Name: Todd Roth MRN:  [id number] Epilepsy Attending: Charlsie Quest Referring Physician/Provider: Erick Blinks, MD Duration: 11/20/2022 1523 to 11/21/2022 1331 Patient history:  37 y.o. 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 EEG to evaluate for seizure. Level of alertness: Awake, asleep AEDs during EEG study: None Technical aspects: This EEG study was done with scalp electrodes positioned according to the 10-20 International system of electrode placement. Electrical activity was reviewed with band pass filter of 1-70Hz , sensitivity of 7 uV/mm, display speed of 31mm/sec with a 60Hz  notched filter applied as appropriate. EEG data were recorded continuously and digitally stored.  Video monitoring was available and reviewed as appropriate. Description: The posterior dominant rhythm consists of 10-11 Hz activity of moderate voltage (25-35 uV) seen predominantly in posterior head regions, symmetric and reactive to eye opening and eye closing. Sleep was characterized by vertex waves, sleep spindles (12 to 14 Hz), maximal frontocentral region. Photic stimulation and hyperventilation were not performed.   Event button was pressed on 11/21/2022 at 1001 and 1204. Per family, patient's eyes were rolling back.  Concomitant EEG before, during and after the event did not show any EEG to suggest seizure.  IMPRESSION: This study is within normal limits. No seizures or epileptiform discharges were seen throughout the recording.  Event button was pressed on 11/21/2022 at 1001 and 1204 for eyes were rolling back without concomitant EEG change.  This was not an epileptic event.  Charlsie Quest   EEG adult  Result Date: 11/20/2022 Charlsie Quest, MD     11/20/2022  8:47 AM Patient Name: Todd Roth MRN: [id number] Epilepsy Attending: Charlsie Quest Referring Physician/Provider: Angie Fava, DO Date: 11/20/2022 Duration: 26.44 mins Patient history: 37yo M with with ams  getting eeg to evaluate for seizure. Level of alertness: Awake, asleep AEDs during EEG study: None Technical aspects: This EEG study was done with scalp electrodes positioned according to the 10-20 International system of electrode placement. Electrical activity was reviewed with band pass filter of 1-70Hz , sensitivity of 7 uV/mm, display speed of 54mm/sec with a 60Hz  notched filter applied as appropriate. EEG data were recorded continuously and digitally stored.  Video monitoring was available and reviewed as appropriate. Description: The posterior dominant rhythm consists of 10-11 Hz activity of moderate voltage (25-35 uV) seen predominantly in posterior head regions, symmetric and reactive to eye opening and eye closing. Sleep was characterized by vertex waves, sleep spindles (12 to 14 Hz), maximal frontocentral region. Physiologic photic driving was seen during photic stimulation. Hyperventilation was not performed.   IMPRESSION: This study is within normal limits. No seizures or epileptiform discharges were seen throughout the recording. A normal interictal EEG does not exclude the diagnosis of epilepsy. Charlsie Quest   CT Head Wo Contrast  Result Date: 11/19/2022 CLINICAL DATA:  Altered mental status. EXAM: CT HEAD WITHOUT CONTRAST TECHNIQUE: Contiguous axial images were obtained from the base of the skull through the vertex without intravenous contrast. RADIATION DOSE REDUCTION: This exam was performed according to the departmental dose-optimization program which includes automated exposure control, adjustment of the mA and/or kV according to patient size and/or use of  iterative reconstruction technique. COMPARISON:  11/18/2022 FINDINGS: Brain: No evidence of acute infarction, hemorrhage, hydrocephalus, extra-axial collection or mass lesion/mass effect. Vascular: No hyperdense vessel or unexpected calcification. Skull: Normal. Negative for fracture or focal lesion. Sinuses/Orbits: No acute finding.  Other: None. IMPRESSION: No acute intracranial pathology. Electronically Signed   By: Signa Kell M.D.   On: 11/19/2022 18:11   MR BRAIN WO CONTRAST  Result Date: 11/18/2022 CLINICAL DATA:  37 year old male with altered mental status. Neurologic deficit. EXAM: MRI HEAD WITHOUT CONTRAST TECHNIQUE: Multiplanar, multiecho pulse sequences of the brain and surrounding structures were obtained without intravenous contrast. COMPARISON:  None Available. FINDINGS: Brain: No restricted diffusion to suggest acute infarction. No midline shift, mass effect, evidence of mass lesion, ventriculomegaly, extra-axial collection or acute intracranial hemorrhage. Cervicomedullary junction and pituitary are within normal limits. Wallace Cullens and white matter signal is within normal limits throughout the brain. No encephalomalacia or chronic cerebral blood products identified. Vascular: Major intracranial vascular flow voids are preserved. Skull and upper cervical spine: Negative. Visualized bone marrow signal is within normal limits. Sinuses/Orbits: Orbit mild motion artifact, otherwise negative. Other: Mastoids are clear. Grossly normal visible internal auditory structures. Negative visible scalp and face. IMPRESSION: Normal noncontrast MRI appearance of the Brain. Electronically Signed   By: Odessa Fleming M.D.   On: 11/18/2022 06:11   CT ANGIO HEAD NECK W WO CM  Result Date: 11/18/2022 CLINICAL DATA:  37 year old male with altered mental status. Neurologic deficit. EXAM: CT ANGIOGRAPHY HEAD AND NECK WITH AND WITHOUT CONTRAST TECHNIQUE: Multidetector CT imaging of the head and neck was performed using the standard protocol during bolus administration of intravenous contrast. Multiplanar CT image reconstructions and MIPs were obtained to evaluate the vascular anatomy. Carotid stenosis measurements (when applicable) are obtained utilizing NASCET criteria, using the distal internal carotid diameter as the denominator. RADIATION DOSE  REDUCTION: This exam was performed according to the departmental dose-optimization program which includes automated exposure control, adjustment of the mA and/or kV according to patient size and/or use of iterative reconstruction technique. CONTRAST:  75mL OMNIPAQUE IOHEXOL 350 MG/ML SOLN COMPARISON:  Head CT 0339 hours today. FINDINGS: CTA NECK Skeleton: Negative aside from impacted or supernumerary right maxillary tooth. No acute osseous abnormality identified. Upper chest: Negative. Other neck: Negative. Aortic arch: Good contrast bolus.  Normal 3 vessel arch. Right carotid system: Negative aside from right ICA tortuosity distal to the bulb, just below the skull base. Left carotid system: Negative, similar left ICA tortuosity. Vertebral arteries: Proximal subclavian arteries and both cervical vertebral arteries are normal. Fairly codominant vertebral arteries. CTA HEAD Posterior circulation: Mildly dominant left vertebral V4 segment. Normal distal vertebral arteries, PICA origins, vertebrobasilar junction. Patent basilar artery without stenosis. Normal SCA and right PCA origins. Fetal type left PCA origin. Right posterior communicating artery diminutive or absent. Bilateral PCA branches are within normal limits. Anterior circulation: Both ICA siphons are patent. No siphon plaque or stenosis. Normal ophthalmic and left posterior communicating artery origins. Patent carotid termini, normal MCA and ACA origins. Normal anterior communicating artery. Bilateral ACA branches are within normal limits. Left MCA M1 segment and bifurcation are patent without stenosis. Right MCA M1 segment and bifurcation are patent without stenosis. Bilateral MCA branches are within normal limits. Venous sinuses: Patent. Anatomic variants: None. Review of the MIP images confirms the above findings IMPRESSION: Normal CTA Head and Neck. Electronically Signed   By: Odessa Fleming M.D.   On: 11/18/2022 05:39   CT HEAD WO CONTRAST ( )  Result  Date:  11/18/2022 CLINICAL DATA:  Altered mental status. EXAM: CT HEAD WITHOUT CONTRAST TECHNIQUE: Contiguous axial images were obtained from the base of the skull through the vertex without intravenous contrast. RADIATION DOSE REDUCTION: This exam was performed according to the departmental dose-optimization program which includes automated exposure control, adjustment of the mA and/or kV according to patient size and/or use of iterative reconstruction technique. COMPARISON:  None Available. FINDINGS: Brain: No evidence of acute infarction, hemorrhage, hydrocephalus, extra-axial collection or mass lesion/mass effect. Vascular: No hyperdense vessel or unexpected calcification. Skull: Normal. Negative for fracture or focal lesion. Sinuses/Orbits: No acute finding. Other: None. IMPRESSION: No acute intracranial pathology. Electronically Signed   By: Aram Candela M.D.   On: 11/18/2022 03:45     Time coordinating discharge: Over 30 minutes    Lewie Chamber, MD  [facility] 11/23/2022, 2:15 PM

## 2022-12-17 ENCOUNTER — Telehealth: Payer: Self-pay

## 2022-12-17 NOTE — Telephone Encounter (Signed)
Spoke with pt and let him know if he was wanting the [place] [employer] to be billed for services through our office that he would need to contact them and have them send an authorization/referral over to office. He said he is working on it but not having luck with them contacting him back. I let the patient know that without that auth I could not schedule him in our office as they would not cover the services.

## 2023-01-30 ENCOUNTER — Encounter: Payer: Self-pay | Admitting: *Deleted

## 2023-01-30 DIAGNOSIS — M79672 Pain in left foot: Secondary | ICD-10-CM

## 2023-01-30 DIAGNOSIS — Z87820 Personal history of traumatic brain injury: Secondary | ICD-10-CM | POA: Insufficient documentation

## 2023-01-30 DIAGNOSIS — K219 Gastro-esophageal reflux disease without esophagitis: Secondary | ICD-10-CM | POA: Insufficient documentation

## 2023-01-30 DIAGNOSIS — E669 Obesity, unspecified: Secondary | ICD-10-CM | POA: Insufficient documentation

## 2023-01-30 DIAGNOSIS — F251 Schizoaffective disorder, depressive type: Secondary | ICD-10-CM

## 2023-02-04 ENCOUNTER — Other Ambulatory Visit: Payer: Self-pay

## 2023-02-04 ENCOUNTER — Encounter (HOSPITAL_BASED_OUTPATIENT_CLINIC_OR_DEPARTMENT_OTHER): Payer: Self-pay

## 2023-02-04 ENCOUNTER — Encounter: Payer: Self-pay | Admitting: Neurology

## 2023-02-04 ENCOUNTER — Ambulatory Visit (INDEPENDENT_AMBULATORY_CARE_PROVIDER_SITE_OTHER): Payer: Self-pay | Admitting: Neurology

## 2023-02-04 ENCOUNTER — Emergency Department (HOSPITAL_BASED_OUTPATIENT_CLINIC_OR_DEPARTMENT_OTHER)
Admission: EM | Admit: 2023-02-04 | Discharge: 2023-02-04 | Disposition: A | Discharge status: Home / Self Care | Payer: No Typology Code available for payment source | Attending: Emergency Medicine | Admitting: Emergency Medicine

## 2023-02-04 ENCOUNTER — Emergency Department (HOSPITAL_BASED_OUTPATIENT_CLINIC_OR_DEPARTMENT_OTHER): Payer: No Typology Code available for payment source

## 2023-02-04 DIAGNOSIS — E669 Obesity, unspecified: Secondary | ICD-10-CM | POA: Diagnosis not present

## 2023-02-04 DIAGNOSIS — G9340 Encephalopathy, unspecified: Secondary | ICD-10-CM

## 2023-02-04 DIAGNOSIS — G4719 Other hypersomnia: Secondary | ICD-10-CM

## 2023-02-04 LAB — CBC WITH DIFFERENTIAL/PLATELET
Abs Immature Granulocytes: 0.02 10*3/uL (ref 0.00–0.07)
Basophils Absolute: 0 10*3/uL (ref 0.0–0.1)
Basophils Relative: 0 %
Eosinophils Absolute: 0.1 10*3/uL (ref 0.0–0.5)
Eosinophils Relative: 2 %
HCT: 44.3 % (ref 39.0–52.0)
Hemoglobin: 14.3 g/dL (ref 13.0–17.0)
Immature Granulocytes: 0 %
Lymphocytes Relative: 30 %
Lymphs Abs: 1.7 10*3/uL (ref 0.7–4.0)
MCH: 28.4 pg (ref 26.0–34.0)
MCHC: 32.3 g/dL (ref 30.0–36.0)
MCV: 88.1 fL (ref 80.0–100.0)
Monocytes Absolute: 0.4 10*3/uL (ref 0.1–1.0)
Monocytes Relative: 6 %
Neutro Abs: 3.4 10*3/uL (ref 1.7–7.7)
Neutrophils Relative %: 62 %
Platelets: 187 10*3/uL (ref 150–400)
RBC: 5.03 MIL/uL (ref 4.22–5.81)
RDW: 14.2 % (ref 11.5–15.5)
WBC: 5.5 10*3/uL (ref 4.0–10.5)
nRBC: 0 % (ref 0.0–0.2)

## 2023-02-04 LAB — URINALYSIS, ROUTINE W REFLEX MICROSCOPIC
Bilirubin Urine: NEGATIVE
Glucose, UA: NEGATIVE mg/dL
Hgb urine dipstick: NEGATIVE
Ketones, ur: NEGATIVE mg/dL
Leukocytes,Ua: NEGATIVE
Nitrite: NEGATIVE
Protein, ur: NEGATIVE mg/dL
Specific Gravity, Urine: 1.005 (ref 1.005–1.030)
pH: 5.5 (ref 5.0–8.0)

## 2023-02-04 LAB — COMPREHENSIVE METABOLIC PANEL
ALT: 26 U/L (ref 0–44)
AST: 24 U/L (ref 15–41)
Albumin: 4.5 g/dL (ref 3.5–5.0)
Alkaline Phosphatase: 62 U/L (ref 38–126)
Anion gap: 7 (ref 5–15)
BUN: 12 mg/dL (ref 6–20)
CO2: 26 mmol/L (ref 22–32)
Calcium: 9.5 mg/dL (ref 8.9–10.3)
Chloride: 107 mmol/L (ref 98–111)
Creatinine, Ser: 0.89 mg/dL (ref 0.61–1.24)
GFR, Estimated: >60 mL/min (ref >60)
Glucose, Bld: 96 mg/dL (ref 70–99)
Potassium: 4.3 mmol/L (ref 3.5–5.1)
Sodium: 140 mmol/L (ref 135–145)
Total Bilirubin: 0.6 mg/dL (ref 0.3–1.2)
Total Protein: 7.3 g/dL (ref 6.5–8.1)

## 2023-02-04 LAB — I-STAT VENOUS BLOOD GAS, ED
Acid-Base Excess: 1 mmol/L (ref 0.0–2.0)
Bicarbonate: 27.3 mmol/L (ref 20.0–28.0)
Calcium, Ion: 1.22 mmol/L (ref 1.15–1.40)
HCT: 43 % (ref 39.0–52.0)
Hemoglobin: 14.6 g/dL (ref 13.0–17.0)
O2 Saturation: 34 %
Potassium: 4.4 mmol/L (ref 3.5–5.1)
Sodium: 140 mmol/L (ref 135–145)
TCO2: 29 mmol/L (ref 22–32)
pCO2, Ven: 51.5 mmHg (ref 44–60)
pH, Ven: 7.333 (ref 7.25–7.43)
pO2, Ven: 22 mmHg — CL (ref 32–45)

## 2023-02-04 LAB — RAPID URINE DRUG SCREEN, HOSP PERFORMED
Amphetamines: NOT DETECTED
Barbiturates: NOT DETECTED
Benzodiazepines: NOT DETECTED
Cocaine: NOT DETECTED
Opiates: NOT DETECTED
Tetrahydrocannabinol: NOT DETECTED

## 2023-02-04 LAB — SALICYLATE LEVEL: Salicylate Lvl: <7 mg/dL — ABNORMAL LOW (ref 7.0–30.0)

## 2023-02-04 LAB — AMMONIA: Ammonia: 28 umol/L (ref 9–35)

## 2023-02-04 LAB — ACETAMINOPHEN LEVEL: Acetaminophen (Tylenol), Serum: <10 ug/mL — ABNORMAL LOW (ref 10–30)

## 2023-02-04 LAB — ETHANOL: Alcohol, Ethyl (B): <10 mg/dL (ref <10)

## 2023-02-04 LAB — CBG MONITORING, ED: Glucose-Capillary: 87 mg/dL (ref 70–99)

## 2023-02-04 NOTE — Patient Instructions (Addendum)
We will cancel today's appointment as you are not responsive enough.I offered to call 911 but your wife would like to take you to the emergency room herself.  I would like for you to be evaluated immediately through the emergency room.  You may need admission to the hospital for further evaluation.

## 2023-02-04 NOTE — ED Triage Notes (Signed)
Patient here POV from Home.  Endorses recent Lethargy over past 2-3 Days.   Recent Admission and discharge for same in April. States "I'm not good".   NAD Noted during Triage but is extremely lethargic. Responsive to Verbal Stimuli but not very communicative during triage. Ambulatory.

## 2023-02-04 NOTE — Progress Notes (Signed)
We will cancel today's appointment. Patient is not responding to verbal or tactile stimuli, not opening his eyes with repeated requests or stimuli.  Blood pressure mildly elevated, patient not tachypneic, able to sit in a chair upright, without support, but not opening his eyes and only unintelligible speech is uttered, with eyes remaining closed the whole time.    Chest is clear to auscultation, heart sounds normal, regular rate and rhythm.    I feel the patient needs to be immediately medically evaluated, differential diagnosis is vast and includes encephalopathy, encephalitis, altered mental status due to metabolic derangement, infection, substance, or medication side effects.  Stroke not excluded either.  No abnormal involuntary movements are seen, no twitching, no stereotypical movements, no tremor noted.  Patient's wife is in agreement, she declines for [place] to call 911 and wants to take him to the emergency room herself.  She reports that he was able to get out of the car and walk to the exam room without assistance but is essentially not responding to me, needs to be medically investigated ASAP.    I explained to the patient's wife that a sleep study in this condition may also not be possible as he is not cooperative or interactive.  He may need to be referred to a sleep clinic based out of a hospital facility.    She reports that he has had episodes like this since April 2024.  He may need to be evaluated as an inpatient.   I will not bill him for this appointment today.  I offered rescheduling at a later date when he is more interactive hopefully.  Patient's wife voices understanding and agreement.  She indicates that he currently does not drive.

## 2023-02-04 NOTE — ED Notes (Signed)
Patient transported to CT 

## 2023-02-04 NOTE — Discharge Instructions (Addendum)
Make an appointment to follow back up with neurology.  Workup here today without any acute findings.  Ammonia level was fine head CT negative.

## 2023-02-04 NOTE — ED Notes (Signed)
Pt family deny any medications

## 2023-02-04 NOTE — ED Notes (Signed)
RN reviewed discharge instructions with pt. Pt verbalized understanding and had no further questions. VSS upon discharge.  

## 2023-02-04 NOTE — ED Provider Notes (Signed)
[place] [facility] AT [facility] Provider Note  CSN: [id number] Arrival date & time: 02/04/23 1205  Chief Complaint(s) Fatigue  HPI Todd Roth is a 37 y.o. male with past medical history as below, significant for seizure disorder, esophageal reflux, schizoaffective disorder, TBI who presents to the ED with complaint of AMS.  Patient was sent from neurology office secondary to altered mental status, per neurology note patient was somnolent, essentially unresponsive and was speaking gibberish.  Brought to the ED by his wife and mother-in-law.  Initially in triage she remained hypersomnolent however he did spontaneously wake up and felt much better.  Patient reports that he normally sleeps until around 1 PM in the day and if it is not consume large amount of caffeine he is typically very sleepy if he wakes up prior to that.  He was admitted 4/22 with similar complaint, cephalopathy.  EEG at that time was negative.  Concerned that time for sleep apnea, underlying psych disorder.  Ammonia was found to be elevated at time.  Medications were adjusted specifically Lyrica as it was reduced as was amitriptyline.  Patient here today due to similar complaints.  He has been sleepy over the past few days, he was seen in neurology office sent to the ED for evaluation due to altered mental status, somnolence.   On my assessment he reports he is sleepy but otherwise has no acute complaints.  He is acting at his baseline per spouse and mother-in-law at bedside.  Denies recent illicit drug use, no recent alcohol use.  No recent medication changes.  He has been unable to follow-up for his sleep study and is not scheduled until December.  [facility]  Past Medical History Past Medical History:  Diagnosis Date   Esophageal reflux    Seizure disorder [facility])    Patient Active Problem List   Diagnosis Date Noted   Esophageal reflux 01/30/2023   Pain in left foot  01/30/2023   Schizoaffective disorder, depressive type (HCC) 01/30/2023   Personal history of traumatic brain injury 01/30/2023   Obesity, unspecified 01/30/2023   AMS (altered mental status) 11/19/2022   Home Medication(s) Prior to Admission medications   Medication Sig Start Date End Date Taking? Authorizing Provider  amitriptyline (ELAVIL) 25 MG tablet Take 1 tablet (25 mg total) by mouth at bedtime. 11/23/22   Lewie Chamber, MD  cyanocobalamin 1000 MCG tablet Take 1 tablet (1,000 mcg total) by mouth daily. 11/24/22   Lewie Chamber, MD  Multiple Vitamin (MULTIVITAMIN WITH MINERALS) TABS tablet Take 1 tablet by mouth daily. 11/24/22   Lewie Chamber, MD  pantoprazole (PROTONIX) 20 MG tablet Take 20 mg by mouth 2 (two) times daily before a meal.    [provider]  pregabalin (LYRICA) 300 MG capsule Take 300 mg by mouth 2 (two) times daily.    [provider]  propranolol (INDERAL) 40 MG tablet Take 20-40 mg by mouth 3 (three) times daily. 03/20/22   [provider]  SUMAtriptan (IMITREX) 100 MG tablet Take 100 mg by mouth every 2 (two) hours as needed for migraine. May repeat in 2 hours if headache persists or recurs.    [provider]  thiamine (VITAMIN B1) 100 MG tablet Take 1 tablet (100 mg total) by mouth daily. 11/23/22   Lewie Chamber, MD  Past Surgical History Past Surgical History:  Procedure Laterality Date   eye surgery     REFRACTIVE SURGERY     Family History History reviewed. No pertinent family history.  Social History Social History   Tobacco Use   Smoking status: Never   Smokeless tobacco: Never  Substance Use Topics   Alcohol use: No   Drug use: No   Allergies Metronidazole  Review of Systems Review of Systems  Constitutional:  Positive for fatigue. Negative for chills and fever.  HENT:   Negative for facial swelling and trouble swallowing.   Eyes:  Negative for photophobia and visual disturbance.  Respiratory:  Negative for cough and shortness of breath.   Cardiovascular:  Negative for chest pain and palpitations.  Gastrointestinal:  Negative for abdominal pain, nausea and vomiting.  Endocrine: Negative for polydipsia and polyuria.  Genitourinary:  Negative for difficulty urinating and hematuria.  Musculoskeletal:  Negative for gait problem and joint swelling.  Skin:  Negative for pallor and rash.  Neurological:  Negative for syncope and headaches.  Psychiatric/Behavioral:  Positive for sleep disturbance. Negative for agitation and confusion.     Physical Exam Vital Signs  I have reviewed the triage vital signs BP (!) 131/97 (BP Location: Right Arm)   Pulse 65   Temp 98.2 F (36.8 C)   Resp 14   Ht 5\' 8"  (1.727 m)   Wt 105.3 kg   SpO2 100%   BMI 35.30 kg/m  Physical Exam Vitals and nursing note reviewed.  Constitutional:      General: He is not in acute distress.    Appearance: Normal appearance. He is well-developed. He is not ill-appearing.  HENT:     Head: Normocephalic and atraumatic.     Right Ear: External ear normal.     Left Ear: External ear normal.     Mouth/Throat:     Mouth: Mucous membranes are moist.  Eyes:     General: No scleral icterus. Cardiovascular:     Rate and Rhythm: Normal rate and regular rhythm.     Pulses: Normal pulses.     Heart sounds: Normal heart sounds.  Pulmonary:     Effort: Pulmonary effort is normal. No respiratory distress.     Breath sounds: Normal breath sounds.  Abdominal:     General: Abdomen is flat.     Palpations: Abdomen is soft.     Tenderness: There is no abdominal tenderness. There is no guarding.  Musculoskeletal:     Cervical back: No rigidity.     Right lower leg: No edema.     Left lower leg: No edema.  Skin:    General: Skin is warm and dry.     Capillary Refill: Capillary refill takes less  than 2 seconds.  Neurological:     Mental Status: He is alert and oriented to person, place, and time.     GCS: GCS eye subscore is 4. GCS verbal subscore is 5. GCS motor subscore is 6.     Cranial Nerves: Cranial nerves 2-12 are intact. No dysarthria.     Sensory: Sensation is intact.     Motor: Motor function is intact. No tremor.     Coordination: Coordination is intact. Finger-Nose-Finger Test normal.  Psychiatric:        Mood and Affect: Mood normal.        Behavior: Behavior normal.     ED Results and Treatments Labs (all labs ordered are listed, but only abnormal results are displayed)  Labs Reviewed  URINALYSIS, ROUTINE W REFLEX MICROSCOPIC - Abnormal; Notable for the following components:      Result Value   Color, Urine COLORLESS (*)    All other components within normal limits  SALICYLATE LEVEL - Abnormal; Notable for the following components:   Salicylate Lvl <7.0 (*)    All other components within normal limits  ACETAMINOPHEN LEVEL - Abnormal; Notable for the following components:   Acetaminophen (Tylenol), Serum <10 (*)    All other components within normal limits  I-STAT VENOUS BLOOD GAS, ED - Abnormal; Notable for the following components:   pO2, Ven 22 (*)    All other components within normal limits  COMPREHENSIVE METABOLIC PANEL  CBC WITH DIFFERENTIAL/PLATELET  RAPID URINE DRUG SCREEN, HOSP PERFORMED  AMMONIA  ETHANOL  CBG MONITORING, ED                                                                                                                          Radiology No results found.  Pertinent labs & imaging results that were available during my care of the patient were reviewed by me and considered in my medical decision making (see MDM for details).  Medications Ordered in ED Medications - No data to display                                                                                                                                    Procedures Procedures  (including critical care time)  Medical Decision Making / ED Course    Medical Decision Making:    THADEUS LUNDHOLM is a 37 y.o. male  with past medical history as below, significant for seizure disorder, esophageal reflux, schizoaffective disorder, TBI who presents to the ED with complaint of AMS.  . The complaint involves an extensive differential diagnosis and also carries with it a high risk of complications and morbidity.  Serious etiology was considered. Ddx includes but is not limited to: Differential diagnoses for altered mental status includes but is not exclusive to alcohol, illicit or prescription medications, intracranial pathology such as stroke, intracerebral hemorrhage, fever or infectious causes including sepsis, hypoxemia, uremia, trauma, endocrine related disorders such as diabetes, hypoglycemia, thyroid-related diseases, etc.   Complete initial physical exam performed, notably the patient  was sleepy, neuro intact.    Reviewed and confirmed nursing documentation for past medical history, family history, social history.  Vital signs reviewed.      Here with excessive sleepiness, the symptoms have resolved on arrival.  Feels back to baseline.  He was seen at neuro office this morning and sent to the ER secondary to excessive sleepiness. He has been dealing with excessive daytime sleepiness since his TBI over a year ago. He is pending sleep study. He was admitted back in April with his medications titrated.  He is still having excessive sleepiness.  Is only awake for few hours a day. His neuroexam is vocal.  His labs are stable.  He is pending CT and ammonia level.  Signed out to incoming team pending imaging, prior labs and disposition. Can likely discharge home if this negative workup is negative, concern for OSA or narcolepsy perhaps. He is compliant w/ [facility]'s Dr Deretha Emory taking over care     Additional history obtained: -Additional  history obtained from spouse/mother in law -External records from outside source obtained and reviewed including: Chart review including previous notes, labs, imaging, consultation notes including prior admission, medications, prior labs and imaging   Lab Tests: -I ordered, reviewed, and interpreted labs.   The pertinent results include:   Labs Reviewed  SALICYLATE LEVEL - Abnormal; Notable for the following components:      Result Value   Salicylate Lvl <7.0 (*)    All other components within normal limits  ACETAMINOPHEN LEVEL - Abnormal; Notable for the following components:   Acetaminophen (Tylenol), Serum <10 (*)    All other components within normal limits  I-STAT VENOUS BLOOD GAS, ED - Abnormal; Notable for the following components:   pO2, Ven 22 (*)    All other components within normal limits  COMPREHENSIVE METABOLIC PANEL  CBC WITH DIFFERENTIAL/PLATELET  ETHANOL  RAPID URINE DRUG SCREEN, HOSP PERFORMED  URINALYSIS, ROUTINE W REFLEX MICROSCOPIC  AMMONIA  CBG MONITORING, ED    Notable for labs stable  EKG   EKG Interpretation Date/Time:  Monday February 04 2023 12:28:40 EDT Ventricular Rate:  64 PR Interval:  188 QRS Duration:  78 QT Interval:  384 QTC Calculation: 396 R Axis:   -10  Text Interpretation: Normal sinus rhythm Minimal voltage criteria for LVH, may be normal variant ( R in aVL ) Borderline ECG When compared with ECG of 19-Nov-2022 17:30, PREVIOUS ECG IS PRESENT similar to prior no stemi Confirmed by Tanda Rockers ([id number]) on 02/04/2023 2:15:14 PM         Imaging Studies ordered: I ordered imaging studies including CTH I independently visualized the following imaging with scope of interpretation limited to determining acute life threatening conditions related to emergency care; findings noted above, significant for pending at shift change I independently visualized and interpreted imaging. I agree with the radiologist interpretation   Medicines ordered  and prescription drug management: No orders of the defined types were placed in this encounter.   -I have reviewed the patients home medicines and have made adjustments as needed   Consultations Obtained: na   Cardiac Monitoring: The patient was maintained on a cardiac monitor.  I personally viewed and interpreted the cardiac monitored which showed an underlying rhythm of: NSR  Social Determinants of Health:  Diagnosis or treatment significantly limited by social determinants of health: obesity   Reevaluation: After the interventions noted above, I reevaluated the patient and found that they have improved  Co morbidities that complicate the patient evaluation  Past Medical History:  Diagnosis Date   Esophageal reflux    Seizure disorder (HCC)  Dispostion: Disposition decision including need for hospitalization was considered, and patient disposition pending at time of sign out.    Final Clinical Impression(s) / ED Diagnoses Final diagnoses:  Excessive daytime sleepiness     This chart was dictated using voice recognition software.  Despite best efforts to proofread,  errors can occur which can change the documentation meaning.    Sloan Leiter, DO 02/07/23 0745

## 2023-02-04 NOTE — Telephone Encounter (Signed)
Patient needs to reschedule new sleep consult appt. Please assist.

## 2023-02-11 ENCOUNTER — Encounter: Payer: Self-pay | Admitting: Neurology

## 2023-02-11 ENCOUNTER — Ambulatory Visit (INDEPENDENT_AMBULATORY_CARE_PROVIDER_SITE_OTHER): Payer: No Typology Code available for payment source | Admitting: Neurology

## 2023-02-11 VITALS — BP 139/87 | HR 76 | Ht 68.0 in | Wt 232.0 lb

## 2023-02-11 DIAGNOSIS — G4719 Other hypersomnia: Secondary | ICD-10-CM | POA: Diagnosis not present

## 2023-02-11 DIAGNOSIS — E669 Obesity, unspecified: Secondary | ICD-10-CM | POA: Diagnosis not present

## 2023-02-11 DIAGNOSIS — R351 Nocturia: Secondary | ICD-10-CM

## 2023-02-11 DIAGNOSIS — R0683 Snoring: Secondary | ICD-10-CM

## 2023-02-11 NOTE — Progress Notes (Signed)
Subjective:    Patient ID: FAIN FRANCIS is a 37 y.o. male.  HPI    Huston Foley, MD, PhD [facility] [address] [address] [place], [place] [address]  I saw patient, Hughes Wyndham, as a referral from the [place] for evaluation of his sleep disorder, concern for sleep apnea, particularly concern central sleep apnea.  The patient is accompanied by his wife today. We had to cancel an appointment on 02/04/2023, due to patient not responsive enough, his wife was advised to take him to the emergency room.  Mr. Winebarger is a 37 year old male with an underlying medical history of reflux disease, migraine headaches, pain in left foot, traumatic brain injury in 2022, and obesity, who reports significant daytime somnolence since April 2024.  He is followed by [facility] neurology.  He was encouraged to pursue a sleep study.  He has some snoring per wife.  No witnessed apneas are reported.  [facility].  His Epworth sleepiness score is 19/24, fatigue severity score is 59/63.  He lives with his wife, he works as a Scientist, forensic.  He currently drives a car but is strongly advised not to drive when feeling this sleepy.  He no longer takes amitriptyline for migraine headaches.  He has a prescription for Imitrex as needed.  Bedtime is between 10 and midnight and rise time around noon.  He takes a nap in the afternoon, about 1 to 1-1/2 hours long.  Nap is typically around 3 PM.   He reports that he reduced his Lyrica, it was reduced from 300 mg twice daily to 150 mg at bedtime.  He takes it for left leg pain.  He has discomfort at night, denies telltale symptoms of restless leg syndrome, such as an urge to move his legs.  He drinks quite a bit of soda/energy drinks, about 3/day.  Previous to April 2024 he drank more than 4 energy drinks per day on average.  He denies recurrent morning headaches, has nocturia about once per average night.  Weight has increased over the  past couple years.  He does not smoke cigarettes, he does not drink any alcohol.  He is not aware of any family history of sleep apnea.  He was seen in the emergency room on 02/04/2023 and I reviewed the encounter notes.  He spontaneously felt better and came back to baseline.  Of note, he was hospitalized in April 2024 with confusion and weakness.  I reviewed the hospital records.  There was some concern for sleep apnea.  He had hyperammonemia which responded to lactulose.  He was felt to have sedation secondary to medication effect.  He was advised to reduce amitriptyline to 25 mg daily and Lyrica was recommended to be decreased to 150 mg twice daily.  He had a long-term EEG from 11/20/2022 through 11/21/2022 and I reviewed the results:  IMPRESSION: This study is within normal limits. No seizures or epileptiform discharges were seen throughout the recording.   Event button was pressed on 11/21/2022 at 10:01 and 12:04 for eyes were rolling back without concomitant EEG change.  This was not an epileptic event.  Laboratory testing included a UDS on 11/19/2022 which was negative for opiates, cocaine, benzodiazepine, amphetamines, THC, and barbiturates.  Vitamin B12 level was on the lower end of the spectrum on 11/20/2022 at 379.  Vitamin B1 was in the normal range at 108.2.  Ammonia level was 38 upon admission on 11/19/2022, increased to 45 on 11/21/2022  and was normalized by 11/23/2022 at 20.  CK level on 11/20/2022 was in the normal range at 168.  Plasma lactic acid on 11/20/2022 was normal at 1.0, was elevated at 3.0 on 11/19/2022.  He had a head CT without contrast on 11/18/2022 and I reviewed the results: Impression: No acute intracranial pathology.  CT angio head and neck with and without contrast on 11/18/2022 showed:   IMPRESSION: Normal CTA Head and Neck. Brain MRI without contrast on 11/18/2022 showed: Impression: Normal noncontrast MRI appearance of the Brain.   Repeat head CT without contrast on 11/19/2022  showed: Impression: No acute intracranial pathology.  His Past Medical History Is Significant For: Past Medical History:  Diagnosis Date   Esophageal reflux    Seizure disorder (HCC)     His Past Surgical History Is Significant For: Past Surgical History:  Procedure Laterality Date   eye surgery     REFRACTIVE SURGERY      His Family History Is Significant For: History reviewed. No pertinent family history.  His Social History Is Significant For: Social History   Socioeconomic History   Marital status: Married    Spouse name: Not on file   Number of children: Not on file   Years of education: Not on file   Highest education level: Not on file  Occupational History   Not on file  Tobacco Use   Smoking status: Never   Smokeless tobacco: Never  Substance and Sexual Activity   Alcohol use: No   Drug use: No   Sexual activity: Not on file  Other Topics Concern   Not on file  Social History Narrative   Lives with his Wife    Right Handed   2-4 Sodas per Day   Social Determinants of Health   Financial Resource Strain: Not on file  Food Insecurity: No Food Insecurity (11/20/2022)   Hunger Vital Sign    Worried About Running Out of Food in the Last Year: Never true    Ran Out of Food in the Last Year: Never true  Transportation Needs: No Transportation Needs (11/20/2022)   PRAPARE - Administrator, Civil Service (Medical): No    Lack of Transportation (Non-Medical): No  Physical Activity: Not on file  Stress: Not on file  Social Connections: Feeling Socially Integrated (08/04/2021)   Received [facility], [facility]   [place] [phone]: Social Isolation    Frequency of experiencing loneliness or isolation: Never    His Allergies Are:  Allergies  Allergen Reactions   Metronidazole Diarrhea  :   His Current Medications Are:  Outpatient Encounter Medications as of 02/11/2023  Medication Sig   cyanocobalamin 1000 MCG  tablet Take 1 tablet (1,000 mcg total) by mouth daily.   Multiple Vitamin (MULTIVITAMIN WITH MINERALS) TABS tablet Take 1 tablet by mouth daily.   pantoprazole (PROTONIX) 20 MG tablet Take 20 mg by mouth 2 (two) times daily before a meal.   pregabalin (LYRICA) 300 MG capsule Take 300 mg by mouth 2 (two) times daily.   propranolol (INDERAL) 40 MG tablet Take 20-40 mg by mouth 3 (three) times daily.   SUMAtriptan (IMITREX) 100 MG tablet Take 100 mg by mouth every 2 (two) hours as needed for migraine. May repeat in 2 hours if headache persists or recurs.   thiamine (VITAMIN B1) 100 MG tablet Take 1 tablet (100 mg total) by mouth daily.   [DISCONTINUED] amitriptyline (ELAVIL) 25 MG tablet Take 1 tablet (  25 mg total) by mouth at bedtime. (Patient not taking: Reported on 02/11/2023)   No facility-administered encounter medications on file as of 02/11/2023.  :   Review of Systems:  Out of a complete 14 point review of systems, all are reviewed and negative with the exception of these symptoms as listed below:   Review of Systems  Neurological:        Rm 9 with spouse Hillary Pt is well, reports he gets about 10-12 hrs of sleep nightly but he still has a sever increase in daytime fatigue since April. He is also having daily headaches. No known or witnessed apneas.     Objective:  Neurological Exam  Physical Exam Physical Examination:   Vitals:   02/11/23 1112  BP: 139/87  Pulse: 76    General Examination: The patient is a very pleasant 37 y.o. male in no acute distress. He appears well-developed and well-nourished and well groomed.   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  Mild intermittent right exotropia noted.  Extraocular tracking is good without limitation to gaze excursion or nystagmus noted. Hearing is grossly intact. Face  is symmetric with normal facial animation. Speech is clear with no dysarthria noted. There is no hypophonia. There is no lip, neck/head, jaw or voice tremor. Neck is supple with full range of passive and active motion. There are no carotid bruits on auscultation. Oropharynx exam reveals: mild mouth dryness, adequate dental hygiene and mild airway crowding, due to small airway entry, tonsillar size of about 1+, Mallampati class I.  Neck circumference 17-1/2 inches, moderate overbite noted.  Chest: Clear to auscultation without wheezing, rhonchi or crackles noted.  Heart: S1+S2+0, regular and normal without murmurs, rubs or gallops noted.   Abdomen: Soft, non-tender and non-distended.  Extremities: There is no pitting edema in the distal lower extremities bilaterally.   Skin: Warm and dry without trophic changes noted.   Musculoskeletal: exam reveals no obvious joint deformities.   Neurologically:  Mental status: The patient is awake, alert and oriented in all 4 spheres. His immediate and remote memory, attention, language skills and fund of knowledge are appropriate. There is no evidence of aphasia, agnosia, apraxia or anomia. Speech is clear with normal prosody and enunciation. Thought process is linear. Mood is normal and affect is normal.  Cranial nerves II - XII are as described above under HEENT exam.  Motor exam: Normal bulk, strength and tone is noted. There is no obvious action or resting tremor.  Fine motor skills and coordination: grossly intact.  Cerebellar testing: No dysmetria or intention tremor. There is no truncal or gait ataxia.  Sensory exam: intact to light touch in the upper and lower extremities.  Gait, station and balance: He stands easily. No veering to one side is noted. No leaning to one side is noted. Posture is age-appropriate and stance is narrow based. Gait shows normal stride length and normal pace. No problems turning are noted.   Assessment and Plan:   In  summary, LANORRIS KALISZ is a very pleasant 37 y.o.-year old male with an underlying medical history of reflux disease, migraine headaches, pain in left foot, traumatic brain injury in 2022, and obesity, whose history and physical exam are concerning for sleep disordered breathing, supporting a current working diagnosis of unspecified sleep apnea, with the main differential diagnoses of obstructive sleep  apnea (OSA) versus upper airway resistance syndrome (UARS) versus central sleep apnea (CSA), or mixed sleep apnea. A laboratory attended sleep study is typically considered "gold standard" for evaluation of sleep disordered breathing.   I had a long chat with the patient and his wife about my findings and the diagnosis of sleep apnea, particularly OSA, its prognosis and treatment options. We talked about medical/conservative treatments, surgical interventions and non-pharmacological approaches for symptom control. I explained, in particular, the risks and ramifications of untreated moderate to severe OSA, especially with respect to developing cardiovascular disease down the road, including congestive heart failure (CHF), difficult to treat hypertension, cardiac arrhythmias (particularly A-fib), neurovascular complications including TIA, stroke and dementia. Even type 2 diabetes has, in part, been linked to untreated OSA. Symptoms of untreated OSA may include (but may not be limited to) daytime sleepiness, nocturia (i.e. frequent nighttime urination), memory problems, mood irritability and suboptimally controlled or worsening mood disorder such as depression and/or anxiety, lack of energy, lack of motivation, physical discomfort, as well as recurrent headaches, especially morning or nocturnal headaches. We talked about the importance of maintaining a healthy lifestyle and striving for healthy weight.   I recommended a sleep study at this time.  I recommend that he not drive when feeling the sleepy.  I  outlined the laboratory sleep study procedure to the patient.   I outlined possible surgical and non-surgical treatment options of OSA, including the use of a positive airway pressure (PAP) device (i.e. CPAP, AutoPAP/APAP or BiPAP in certain circumstances), a custom-made dental device (aka oral appliance, which would require a referral to a specialist dentist or orthodontist typically, and is generally speaking not considered for patients with full dentures or edentulous state), upper airway surgical options, such as traditional UPPP (which is not considered a first-line treatment) or the Inspire device (hypoglossal nerve stimulator, which would involve a referral for consultation with an ENT surgeon, after careful selection, following inclusion criteria - also not first-line treatment). I explained the PAP treatment option to the patient in detail, as this is generally considered first-line treatment.  The patient indicated that he would be willing to try PAP therapy, if the need arises.  I showed the patient and his wife a model of an AutoPap machine.    We will pick up our discussion about the next steps and treatment options after testing.  We will keep him posted as to the test results by phone call and/or MyChart messaging where possible.  We will plan to follow-up in sleep clinic accordingly as well.  I answered all their questions today and the patient and his wife were in agreement.   I encouraged them to call with any interim questions, concerns, problems or updates or email [place] through MyChart.  Generally speaking, sleep test authorizations may take up to 2 weeks, sometimes less, sometimes longer, the patient is encouraged to get in touch with [place] if they do not hear back from the sleep lab staff directly within the next 2 weeks. This was an extended visit of over 1 hour due to extended chart review with hospital records reviewed as well. Thank you very much for allowing me to participate in the care  of this nice patient. If I can be of any further assistance to you please do not hesitate to call me at [phone].  Sincerely,   Huston Foley, MD, PhD

## 2023-02-11 NOTE — Patient Instructions (Addendum)
Thank you for choosing [facility] for your sleep related care! It was nice to meet you today!   Here is what we discussed today:    Based on your symptoms and your exam I believe you are at risk for obstructive sleep apnea (aka OSA). We should proceed with a sleep study to determine whether you do or do not have OSA and how severe it is. Even, if you have mild OSA, I may want you to consider treatment with CPAP, as treatment of even borderline or mild sleep apnea can result and improvement of symptoms such as sleep disruption, daytime sleepiness, nighttime bathroom breaks, restless leg symptoms, improvement of headache syndromes, even improved mood disorder.   Please remember, the long-term risks and ramifications of untreated moderate to severe obstructive sleep apnea may include (but are not limited to): increased risk for cardiovascular disease, including congestive heart failure, stroke, difficult to control hypertension, treatment resistant obesity, arrhythmias, especially irregular heartbeat commonly known as A. Fib. (atrial fibrillation); even type 2 diabetes has been linked to untreated OSA.   Other correlations that untreated obstructive sleep apnea include macular edema which is swelling of the retina in the eyes, droopy eyelid syndrome, and elevated hemoglobin and hematocrit levels (often referred to as polycythemia).  Sleep apnea can cause disruption of sleep and sleep deprivation in most cases, which, in turn, can cause recurrent headaches, problems with memory, mood, concentration, focus, and vigilance. Most people with untreated sleep apnea report excessive daytime sleepiness, which can affect their ability to drive. Please do not drive or use heavy equipment or machinery, if you feel sleepy!  As you indicate significant sleepiness currently, I recommend that you not drive.  Patients with sleep apnea can also develop difficulty initiating and maintaining sleep (aka  insomnia).   Having sleep apnea may increase your risk for other sleep disorders, including involuntary behaviors sleep such as sleep terrors, sleep talking, sleepwalking.    Having sleep apnea can also increase your risk for restless leg syndrome and leg movements at night.   Please note that untreated obstructive sleep apnea may carry additional perioperative morbidity. Patients with significant obstructive sleep apnea (typically, in the moderate to severe degree) should receive, if possible, perioperative PAP (positive airway pressure) therapy and the surgeons and particularly the anesthesiologists should be informed of the diagnosis and the severity of the sleep disordered breathing.   We will call you or email you through MyChart with regards to your test results and plan a follow-up in sleep clinic accordingly. Most likely, you will hear from one of our nurses.   Our sleep lab administrative assistant will call you to schedule your sleep study and give you further instructions, regarding the check in process for the sleep study, arrival time, what to bring, when you can expect to leave after the study, etc., and to answer any other logistical questions you may have. If you don't hear back from her by about 2 weeks from now, please feel free to call her direct line at [phone] or you can call our general clinic number, or email [place] through My Chart.

## 2023-02-27 ENCOUNTER — Telehealth: Payer: Self-pay | Admitting: Neurology

## 2023-02-27 NOTE — Telephone Encounter (Signed)
sent [employer]: [id number] (exp. 12/27/22 to 06/25/23) - will have to do auth for [employer] started after 10/1- before then no auth is req

## 2023-03-10 ENCOUNTER — Ambulatory Visit (INDEPENDENT_AMBULATORY_CARE_PROVIDER_SITE_OTHER): Payer: No Typology Code available for payment source | Admitting: Neurology

## 2023-03-10 DIAGNOSIS — G4719 Other hypersomnia: Secondary | ICD-10-CM

## 2023-03-10 DIAGNOSIS — R0683 Snoring: Secondary | ICD-10-CM | POA: Diagnosis not present

## 2023-03-10 DIAGNOSIS — E669 Obesity, unspecified: Secondary | ICD-10-CM

## 2023-03-10 DIAGNOSIS — G4733 Obstructive sleep apnea (adult) (pediatric): Secondary | ICD-10-CM

## 2023-03-10 DIAGNOSIS — G4720 Circadian rhythm sleep disorder, unspecified type: Secondary | ICD-10-CM

## 2023-03-10 DIAGNOSIS — R351 Nocturia: Secondary | ICD-10-CM

## 2023-03-13 NOTE — Procedures (Signed)
Physician Interpretation:     [facility] POLYSOMNOGRAPHY  INTERPRETATION REPORT   STUDY DATE:  03/10/2023     PATIENT NAME:  Todd Roth         DATE OF BIRTH:  12-Jul-1986  PATIENT ID:  [id number]    TYPE OF STUDY:  PSG  READING PHYSICIAN: Huston Foley, MD, PhD   SCORING TECHNICIAN: Domingo Cocking, RPSGT   Referred by: [facility]  ? History and Indication for Testing: 37 year old male with an underlying medical history of reflux disease, migraine headaches, pain in left foot, traumatic brain injury in 2022, and obesity, who reports snoring and significant daytime somnolence since April 2024. He is followed by [facility] neurology. He was encouraged to pursue a sleep study. His Epworth sleepiness score is 19/24, fatigue severity score is 59/63.  Height: 68 in Weight: 232 lb (BMI 35) Neck Size: 17.5 in.     MEDICATIONS: Cyanocobalamin, Multivitamins w/minerals, Protonix, Lyrica, Inderal, Imitrex, Vitamin B1   TECHNICAL DESCRIPTION: A registered sleep technologist was in attendance for the duration of the recording.  Data collection, scoring, video monitoring, and reporting were performed in compliance with the AASM Manual for the Scoring of Sleep and Associated Events; (Hypopnea is scored based on the criteria listed in Section VIII D. 1b in the AASM Manual V2.6 using a 4% oxygen desaturation rule or Hypopnea is scored based on the criteria listed in Section VIII D. 1a in the AASM Manual V2.6 using 3% oxygen desaturation and /or arousal rule).   SLEEP CONTINUITY AND SLEEP ARCHITECTURE:  Lights-out was at 22:00: and lights-on at  05:00:, with a total recording time of 7 hours. Total sleep time ( TST) was 406.5 minutes with a high sleep efficiency at 96.8%. There was  10.6% REM sleep.  BODY POSITION:  TST was divided  between the following sleep positions: 100.0% supine;  0.0% lateral;  0% prone. Duration of total sleep and percent of total  sleep in their respective position is as follows: supine 406 minutes (100%), non-supine 0 minutes (0%); right 00 minutes (0%), left 00 minutes (0%), and prone 00 minutes (0%).  Total supine REM sleep time was 43 minutes (100% of total REM sleep).  Sleep latency was normal at 7.5 minutes.  REM sleep latency was markedly delayed at 244.0 minutes. Of the total sleep time, the percentage of stage N1 sleep was 2.7%, stage N2 sleep was 77%, which is markedly increased, stage N3 sleep was 9.6%, and REM sleep was 10.6%, which is reduced. Wake after sleep onset (WASO) time accounted for 6 minutes with no significant sleep fragmentation noted.   RESPIRATORY MONITORING:   Based on CMS criteria (using a 4% oxygen desaturation rule for scoring hypopneas), there were 8 apneas (3 obstructive; 0 central; 5 mixed), and 34 hypopneas.  Apnea index was 1.2. Hypopnea index was 5.0. The apnea-hypopnea index was 6.2 overall (6.2 supine, 0 non-supine; 2.8 REM, 2.8 supine REM).  There were 0 respiratory effort-related arousals (RERAs).  The RERA index was 0 events/h. Total respiratory disturbance index (RDI) was 6.2 events/h. RDI results showed: supine RDI  6.2 /h; non-supine RDI 0.0 /h; REM RDI 2.8 /h, supine REM RDI 2.8 /h.   Based on AASM criteria (using a 3% oxygen desaturation and /or arousal rule for scoring hypopneas), there were 8 apneas (3 obstructive; 0 central; 5 mixed), and 52 hypopneas. Apnea index was 1.2. Hypopnea index was 7.7. The apnea-hypopnea index was 8.9/hour overall (8.9 supine, 0 non-supine;  2.8 REM, 2.8 supine REM).  There were 0 respiratory effort-related arousals (RERAs).  The RERA index was 0 events/h. Total respiratory disturbance index (RDI) was 8.9 events/h. RDI results showed: supine RDI  8.9 /h; non-supine RDI 0.0 /h; REM RDI 2.8 /h, supine REM RDI 2.8 /h.  OXIMETRY: Oxyhemoglobin Saturation Nadir during sleep was at 84% from a mean of 93%.  Of the Total sleep time (TST)   hypoxemia (=<88%) was  present for  1.3 minutes, or 0.3% of total sleep time.  LIMB MOVEMENTS: There were 0 periodic limb movements of sleep (0.0/hr), of which 0 (0.0/hr) were associated with an arousal.   AROUSAL: There were 38 arousals in total, for an arousal index of 6 arousals/hour.  Of these, 6 were identified as respiratory-related arousals (1 /h), 0 were PLM-related arousals (0 /h), and 36 were non-specific arousals (5 /h).    EEG: Review of the EEG showed no abnormal electrical discharges and symmetrical bihemispheric findings.    EKG: The EKG revealed normal sinus rhythm (NSR). The average heart rate during sleep was 59 bpm.  AUDIO/VIDEO REVIEW: The audio and video review did not show any abnormal or unusual behaviors, movements, phonations or vocalizations. The patient took no restroom breaks. Snoring was noted, and was intermittent, in the mild to moderate range.  POST-STUDY QUESTIONNAIRE: A post study questionnaire was not completed.    IMPRESSION:  1. Mild Obstructive Sleep Apnea (OSA) 2. Dysfunctions associated with sleep stages or arousal from sleep  RECOMMENDATIONS:  1. This study demonstrates overall mild obstructive sleep apnea, with a total AHI of 8.9/hour, REM AHI of 2.8/hour, supine AHI of 8.9/hour and O2 nadir of 84%. Treatment options include the use of a positive airway pressure (PAP) device such as autoPAP or CPAP. A full-night CPAP titration study can be considered for optimization of therapy, if needed, down the road. Other treatment options may include avoidance of supine sleep position along with weight loss, or the use of an oral appliance in selected patients. These different avenues will be discussed with the patient.  2. Please note, that untreated obstructive sleep apnea may carry additional perioperative morbidity. Patients with significant obstructive sleep apnea should receive perioperative PAP therapy and the surgeons and particularly the anesthesiologist should be informed of the  diagnosis and the severity of the sleep disordered breathing. 3. This study shows no significant sleep fragmentation and abnormal sleep stage percentages; these are nonspecific findings and per se do not signify an intrinsic sleep disorder or a cause for the patient's sleep-related symptoms. Causes include (but are not limited to) the first night effect of the sleep study, circadian rhythm disturbances, medication effect or an underlying mood disorder or medical problem. Clinical correlation is recommended. 4. The patient should be cautioned not to drive, work at heights, or operate dangerous or heavy equipment when tired or sleepy. Review and reiteration of good sleep hygiene measures should be pursued with any patient. 5. The patient will be seen in follow-up at [facility] as necessary. The referring provider will be notified of the test results.   I certify that I have reviewed the entire raw data recording prior to the issuance of this report in accordance with the Standards of Accreditation of the American Academy of Sleep Medicine (AASM).  Huston Foley, MD, PhD Medical Director, [facility]) Diplomat, ABPN (Neurology and Sleep)             Technical Report:   General Information  Name: Todd Roth,  Todd Roth BMI: 35.28 Physician: Huston Foley, MD  ID: [id number] Height: 68.0 in Technician: Domingo Cocking, RPSGT  Sex: Male Weight: 232.0 lb Record: [id number]  Age: 12 [1985-12-31] Date: 03/10/2023    Medical & Medication History    Mr. Hering is a 37 year old male with an underlying medical history of reflux disease, migraine headaches, pain in left foot, traumatic brain injury in 2022, and obesity, who reports significant daytime somnolence since April 2024. He is followed by [facility] neurology. He was encouraged to pursue a sleep study. He has some snoring per wife. No witnessed apneas are reported. [facility]. His Epworth  sleepiness score is 19/24, fatigue severity score is 59/63. He lives with his wife, he works as a Scientist, forensic. He currently drives a car but is strongly advised not to drive when feeling this sleepy.  Cyanocobalamin, Multivitamins w/minerals, Protonix, Lyrica, Inderal, Imitrex, Vitamin B1   Sleep Disorder      Comments   Patient arrived for a diagnostic polysomnogram. Procedure explained and all questions answered. Standard paste setup without complications. Patient slept supine. Mild to moderate snoring heard. Respiratory events observed, worse while supine. After 2 hours total sleep time, AHI = 13. No significant cardiac arrhythmias observed. No significant PLMS observed. No nocturia.    Lights out: 10:00:57 PM Lights on: 05:00:38 AM   Time Total Supine Side Prone Upright  Recording (TRT) 7h 0.55m 7h 0.63m 0h 0.61m 0h 0.60m 0h 0.35m  Sleep (TST) 6h 46.54m 6h 46.80m 0h 0.50m 0h 0.65m 0h 0.37m   Latency N1 N2 N3 REM Onset Per. Slp. Eff.  Actual 0h 0.32m 0h 2.22m 0h 45.40m 4h 4.16m 0h 7.68m 0h 16.64m 96.79%   Stg Dur Wake N1 N2 N3 REM  Total 13.5 11.0 313.5 39.0 43.0  Supine 13.5 11.0 313.5 39.0 43.0  Side 0.0 0.0 0.0 0.0 0.0  Prone 0.0 0.0 0.0 0.0 0.0  Upright 0.0 0.0 0.0 0.0 0.0   Stg % Wake N1 N2 N3 REM  Total 3.2 2.7 77.1 9.6 10.6  Supine 3.2 2.7 77.1 9.6 10.6  Side 0.0 0.0 0.0 0.0 0.0  Prone 0.0 0.0 0.0 0.0 0.0  Upright 0.0 0.0 0.0 0.0 0.0     Apnea Summary Sub Supine Side Prone Upright  Total 8 Total 8 8 0 0 0    REM 1 1 0 0 0    NREM 7 7 0 0 0  Obs 3 REM 1 1 0 0 0    NREM 2 2 0 0 0  Mix 5 REM 0 0 0 0 0    NREM 5 5 0 0 0  Cen 0 REM 0 0 0 0 0    NREM 0 0 0 0 0   Rera Summary Sub Supine Side Prone Upright  Total 0 Total 0 0 0 0 0    REM 0 0 0 0 0    NREM 0 0 0 0 0   Hypopnea Summary Sub Supine Side Prone Upright  Total 52 Total 52 52 0 0 0    REM 1 1 0 0 0    NREM 51 51 0 0 0   4% Hypopnea Summary Sub Supine Side Prone Upright  Total (4%) 34 Total 34 34 0 0 0    REM 1 1 0 0 0     NREM 33 33 0 0 0     AHI Total Obs Mix Cen  8.86 Apnea 1.18 0.44 0.74 0.00   Hypopnea 7.68 -- -- --  6.20 Hypopnea (4%) 5.02 -- -- --    Total Supine Side Prone Upright  Position AHI 8.86 8.86 0.00 0.00 0.00  REM AHI 2.79   NREM AHI 9.57   Position RDI 8.86 8.86 0.00 0.00 0.00  REM RDI 2.79   NREM RDI 9.57    4% Hypopnea Total Supine Side Prone Upright  Position AHI (4%) 6.20 6.20 0.00 0.00 0.00  REM AHI (4%) 2.79   NREM AHI (4%) 6.60   Position RDI (4%) 6.20 6.20 0.00 0.00 0.00  REM RDI (4%) 2.79   NREM RDI (4%) 6.60    Desaturation Information Threshold: 2% <100% <90% <80% <70% <60% <50% <40%  Supine 203.0 19.0 0.0 0.0 0.0 0.0 0.0  Side 0.0 0.0 0.0 0.0 0.0 0.0 0.0  Prone 0.0 0.0 0.0 0.0 0.0 0.0 0.0  Upright 0.0 0.0 0.0 0.0 0.0 0.0 0.0  Total 203.0 19.0 0.0 0.0 0.0 0.0 0.0  Index 29.5 2.8 0.0 0.0 0.0 0.0 0.0   Threshold: 3% <100% <90% <80% <70% <60% <50% <40%  Supine 93.0 18.0 0.0 0.0 0.0 0.0 0.0  Side 0.0 0.0 0.0 0.0 0.0 0.0 0.0  Prone 0.0 0.0 0.0 0.0 0.0 0.0 0.0  Upright 0.0 0.0 0.0 0.0 0.0 0.0 0.0  Total 93.0 18.0 0.0 0.0 0.0 0.0 0.0  Index 13.5 2.6 0.0 0.0 0.0 0.0 0.0   Threshold: 4% <100% <90% <80% <70% <60% <50% <40%  Supine 59.0 14.0 0.0 0.0 0.0 0.0 0.0  Side 0.0 0.0 0.0 0.0 0.0 0.0 0.0  Prone 0.0 0.0 0.0 0.0 0.0 0.0 0.0  Upright 0.0 0.0 0.0 0.0 0.0 0.0 0.0  Total 59.0 14.0 0.0 0.0 0.0 0.0 0.0  Index 8.6 2.0 0.0 0.0 0.0 0.0 0.0   Threshold: 3% <100% <90% <80% <70% <60% <50% <40%  Supine 93 18 0 0 0 0 0  Side 0 0 0 0 0 0 0  Prone 0 0 0 0 0 0 0  Upright 0 0 0 0 0 0 0  Total 93 18 0 0 0 0 0   Awakening/Arousal Information # of Awakenings 9  Wake after sleep onset 6.31m  Wake after persistent sleep 4.25m   Arousal Assoc. Arousals Index  Apneas 2 0.3  Hypopneas 4 0.6  Leg Movements 3 0.4  Snore 0 0.0  PTT Arousals 0 0.0  Spontaneous 36 5.3  Total 45 6.6  Leg Movement Information PLMS LMs Index  Total LMs during PLMS 0 0.0  LMs w/ Microarousals 0  0.0   LM LMs Index  w/ Microarousal 3 0.4  w/ Awakening 3 0.4  w/ Resp Event 0 0.0  Spontaneous 17 2.5  Total 20 3.0     Desaturation threshold setting: 3% Minimum desaturation setting: 10 seconds SaO2 nadir: 84% The longest event was a 23 sec obstructive Hypopnea with a minimum SaO2 of 84%. The lowest SaO2 was 84% associated with a 23 sec obstructive Hypopnea. EKG Rates EKG Avg Max Min  Awake 70 89 52  Asleep 59 90 49  EKG Events: N/A

## 2023-04-03 NOTE — Telephone Encounter (Signed)
FYI Pt stated "I feel like there is something else going on other than just sleep apnea. This sleep issue is affecting my daily life; and no offense, a CPAP is not going to fix my problem. A CPAP might be something to try but I don't think it is going to help with my situation, especially since the results show mild sleep apnea. I have done some research and found that there are medications that can actually help people who have sleep apnea and other sleep disorders. I would prefer to go that route instead of the CPAP. I think it is insane to choose a machine type of treatment over a medication, especially if a medication can help. I refuse to try the CPAP ". His OSA was mild and I did reiterate your recommendations, other than CPAP, to him.

## 2023-04-09 NOTE — Telephone Encounter (Signed)
I recommend that he be treated with AutoPap therapy for his obstructive sleep apnea first as this may help his daytime sleepiness.  If he is not yet set up on AutoPap therapy, I recommend that he start treatment at home with a machine.  We can then arrange for follow-up in 2 to 3 months and review his compliance as well as his symptoms at the time and his apnea control on treatment.  If he still has significant daytime sleepiness once he is established on AutoPap therapy, we can certainly consider further evaluation for an underlying hypersomnolence disorder meaning additional sleepiness disorder.  This would mean a repeat sleep study and next day nap testing.  He would have to be tapered off of his Lyrica at the time but we can certainly talk about it during his follow-up appointment in more detail.

## 2023-07-01 ENCOUNTER — Other Ambulatory Visit: Payer: Self-pay

## 2023-07-01 ENCOUNTER — Encounter (HOSPITAL_BASED_OUTPATIENT_CLINIC_OR_DEPARTMENT_OTHER): Payer: Self-pay

## 2023-07-01 ENCOUNTER — Emergency Department (HOSPITAL_BASED_OUTPATIENT_CLINIC_OR_DEPARTMENT_OTHER)
Admission: EM | Admit: 2023-07-01 | Discharge: 2023-07-02 | Disposition: A | Discharge status: Home / Self Care | Payer: No Typology Code available for payment source | Attending: Emergency Medicine | Admitting: Emergency Medicine

## 2023-07-01 ENCOUNTER — Emergency Department (HOSPITAL_BASED_OUTPATIENT_CLINIC_OR_DEPARTMENT_OTHER): Payer: No Typology Code available for payment source | Admitting: Radiology

## 2023-07-01 DIAGNOSIS — S8992XA Unspecified injury of left lower leg, initial encounter: Secondary | ICD-10-CM | POA: Diagnosis present

## 2023-07-01 DIAGNOSIS — S8392XA Sprain of unspecified site of left knee, initial encounter: Secondary | ICD-10-CM | POA: Diagnosis not present

## 2023-07-01 DIAGNOSIS — X501XXA Overexertion from prolonged static or awkward postures, initial encounter: Secondary | ICD-10-CM | POA: Diagnosis not present

## 2023-07-01 NOTE — ED Triage Notes (Signed)
Pt arrives with left knee pain after a knee injury happen last week. Pt stepped off a curb and twisted. Per pt, knee has continued to swell and pain has increased. Pt did injury same knee about 6 weeks ago.

## 2023-07-02 MED ORDER — KETOROLAC TROMETHAMINE 30 MG/ML IJ SOLN
30.0000 mg | Freq: Once | INTRAMUSCULAR | Status: AC
  Administered 2023-07-02: 30 mg via INTRAMUSCULAR
  Filled 2023-07-02: qty 1

## 2023-07-02 NOTE — Discharge Instructions (Addendum)
You were seen today for knee pain.  This is likely a sprain.  Keep knee immobilizer in place.  Use crutches as needed.  Keep iced and elevated.  You may take ibuprofen for pain.  Follow-up with orthopedics as scheduled today.  If you want a second opinion, you may call the orthopedist listed above.

## 2023-07-02 NOTE — ED Provider Notes (Signed)
EMERGENCY DEPARTMENT AT [facility] Provider Note   CSN: [id number] Arrival date & time: 07/01/23  2224     History  Chief Complaint  Patient presents with   Knee Injury    Todd Roth is a 37 y.o. male.  HPI     This is a 37 year old male who presents with left knee pain.  Patient reports progressive left knee pain and swelling.  He states he initially noted pain and swelling in mid October.  At that time he had been walking up multiple steps and thinks he may have twisted it.  Last week he subsequently stepped off a curb and twisted it again.  He has had intermittent pain and swelling over the last 6 to 8 weeks.  He has not seen orthopedics but has an orthopedic follow-up later today at the [place].  He saw his primary doctor and reports negative x-ray imaging during this time.  No history of gout.  Has not had any fevers or systemic symptoms.  Home Medications Prior to Admission medications   Medication Sig Start Date End Date Taking? Authorizing Provider  cyanocobalamin 1000 MCG tablet Take 1 tablet (1,000 mcg total) by mouth daily. 11/24/22   Lewie Chamber, MD  Multiple Vitamin (MULTIVITAMIN WITH MINERALS) TABS tablet Take 1 tablet by mouth daily. 11/24/22   Lewie Chamber, MD  pantoprazole (PROTONIX) 20 MG tablet Take 20 mg by mouth 2 (two) times daily before a meal.    [provider]  pregabalin (LYRICA) 300 MG capsule Take 300 mg by mouth 2 (two) times daily.    [provider]  propranolol (INDERAL) 40 MG tablet Take 20-40 mg by mouth 3 (three) times daily. 03/20/22   [provider]  SUMAtriptan (IMITREX) 100 MG tablet Take 100 mg by mouth every 2 (two) hours as needed for migraine. May repeat in 2 hours if headache persists or recurs.    [provider]  thiamine (VITAMIN B1) 100 MG tablet Take 1 tablet (100 mg total) by mouth daily. 11/23/22   Lewie Chamber, MD      Allergies    Metronidazole    Review of  Systems   Review of Systems  Constitutional:  Negative for fever.  Musculoskeletal:        Knee pain  All other systems reviewed and are negative.   Physical Exam Updated Vital Signs BP (!) 155/113   Pulse 85   Temp (!) 97.5 F (36.4 C) (Oral)   Resp 20   Wt 105.2 kg   SpO2 98%   BMI 35.28 kg/m  Physical Exam Vitals and nursing note reviewed.  Constitutional:      Appearance: He is well-developed. He is not ill-appearing.  HENT:     Head: Normocephalic and atraumatic.  Cardiovascular:     Rate and Rhythm: Normal rate and regular rhythm.  Pulmonary:     Effort: Pulmonary effort is normal. No respiratory distress.  Musculoskeletal:     Cervical back: Neck supple.     Comments: Focused examination of the left knee with tenderness to palpation mostly over the medial joint line, there is an effusion, no overlying skin changes, erythema or warmth, pain with range of motion, no significant joint laxity noted, 2+ DP pulse  Lymphadenopathy:     Cervical: No cervical adenopathy.  Skin:    General: Skin is warm and dry.  Neurological:     Mental Status: He is alert and oriented to person, place, and time.  Psychiatric:        Mood and Affect: Mood normal.     ED Results / Procedures / Treatments   Labs (all labs ordered are listed, but only abnormal results are displayed) Labs Reviewed - No data to display  EKG None  Radiology DG Knee Complete 4 Views Left  Result Date: 07/02/2023 CLINICAL DATA:  Status post trauma. EXAM: LEFT KNEE - COMPLETE 4+ VIEW COMPARISON:  None Available. FINDINGS: No evidence of an acute fracture or dislocation. No evidence of arthropathy or other focal bone abnormality. A small joint effusion is noted. IMPRESSION: Small joint effusion without evidence of acute fracture or dislocation. Electronically Signed   By: Aram Candela M.D.   On: 07/02/2023 01:23    Procedures Procedures    Medications Ordered in ED Medications  ketorolac  (TORADOL) 30 MG/ML injection 30 mg (30 mg Intramuscular Given 07/02/23 0019)    ED Course/ Medical Decision Making/ A&P                                 Medical Decision Making Amount and/or Complexity of Data Reviewed Radiology: ordered.  Risk Prescription drug management.   This patient presents to the ED for concern of left knee pain, this involves an extensive number of treatment options, and is a complaint that carries with it a high risk of complications and morbidity.  I considered the following differential and admission for this acute, potentially life threatening condition.  The differential diagnosis includes sprain, ligamentous or meniscus injury, fracture, inflammatory arthritis, less likely septic arthritis  MDM:    This is a 37 year old male who presents with left knee pain.  He is overall nontoxic and vital signs are reassuring.  He has had pain for 6 to 8 weeks in the left knee.  Does report potential injury and exacerbation of symptoms after mis-stepping off a curve.  No history of gout or inflammatory arthritis.  He has a notable effusion but no warmth erythema to the joint.  No other signs or symptoms of septic joint.  I did a quick bedside ultrasound with no obvious drainable fluid collection.  We discussed the risk and benefits of arthrocentesis.  Have low suspicion of septic joint.  Arthrocentesis would inform the differential of inflammatory arthritis although sprain is also high on the differential.  He has an orthopedic appointment later today.  Will provide with a knee immobilizer.  Recommend ice and elevation and anti-inflammatories.  (Labs, imaging, consults)  Labs: I Ordered, and personally interpreted labs.  The pertinent results include: None  Imaging Studies ordered: I ordered imaging studies including x-ray knee negative I independently visualized and interpreted imaging. I agree with the radiologist interpretation  Additional history obtained from wife  at bedside.  External records from outside source obtained and reviewed including prior evaluations  Cardiac Monitoring: The patient was not maintained on a cardiac monitor.  If on the cardiac monitor, I personally viewed and interpreted the cardiac monitored which showed an underlying rhythm of: N/A  Reevaluation: After the interventions noted above, I reevaluated the patient and found that they have :stayed the same  Social Determinants of Health:  lives independently  Disposition: Discharge  Co morbidities that complicate the patient evaluation  Past Medical History:  Diagnosis Date   Esophageal reflux    Seizure disorder (HCC)      Medicines Meds ordered this encounter  Medications   ketorolac (TORADOL) 30 MG/ML  injection 30 mg    I have reviewed the patients home medicines and have made adjustments as needed  Problem List / ED Course: Problem List Items Addressed This Visit   None Visit Diagnoses     Sprain of left knee, unspecified ligament, initial encounter    -  Primary                   Final Clinical Impression(s) / ED Diagnoses Final diagnoses:  Sprain of left knee, unspecified ligament, initial encounter    Rx / DC Orders ED Discharge Orders     None         Shon Baton, MD 07/02/23 0147

## 2024-01-29 ENCOUNTER — Other Ambulatory Visit: Payer: Self-pay

## 2024-01-29 ENCOUNTER — Emergency Department (HOSPITAL_BASED_OUTPATIENT_CLINIC_OR_DEPARTMENT_OTHER)

## 2024-01-29 ENCOUNTER — Emergency Department (HOSPITAL_BASED_OUTPATIENT_CLINIC_OR_DEPARTMENT_OTHER)
Admission: EM | Admit: 2024-01-29 | Discharge: 2024-01-29 | Disposition: A | Discharge status: Home / Self Care | Attending: Emergency Medicine | Admitting: Emergency Medicine

## 2024-01-29 ENCOUNTER — Encounter (HOSPITAL_BASED_OUTPATIENT_CLINIC_OR_DEPARTMENT_OTHER): Payer: Self-pay | Admitting: *Deleted

## 2024-01-29 DIAGNOSIS — R400 Somnolence: Secondary | ICD-10-CM | POA: Diagnosis present

## 2024-01-29 DIAGNOSIS — R519 Headache, unspecified: Secondary | ICD-10-CM | POA: Diagnosis not present

## 2024-01-29 LAB — CBC WITH DIFFERENTIAL/PLATELET
Abs Immature Granulocytes: 0.01 10*3/uL (ref 0.00–0.07)
Basophils Absolute: 0 10*3/uL (ref 0.0–0.1)
Basophils Relative: 0 %
Eosinophils Absolute: 0.1 10*3/uL (ref 0.0–0.5)
Eosinophils Relative: 2 %
HCT: 45.2 % (ref 39.0–52.0)
Hemoglobin: 15.9 g/dL (ref 13.0–17.0)
Immature Granulocytes: 0 %
Lymphocytes Relative: 27 %
Lymphs Abs: 1.8 10*3/uL (ref 0.7–4.0)
MCH: 31.9 pg (ref 26.0–34.0)
MCHC: 35.2 g/dL (ref 30.0–36.0)
MCV: 90.8 fL (ref 80.0–100.0)
Monocytes Absolute: 0.5 10*3/uL (ref 0.1–1.0)
Monocytes Relative: 8 %
Neutro Abs: 4.2 10*3/uL (ref 1.7–7.7)
Neutrophils Relative %: 63 %
Platelets: 204 10*3/uL (ref 150–400)
RBC: 4.98 MIL/uL (ref 4.22–5.81)
RDW: 12.6 % (ref 11.5–15.5)
WBC: 6.7 10*3/uL (ref 4.0–10.5)
nRBC: 0 % (ref 0.0–0.2)

## 2024-01-29 LAB — COMPREHENSIVE METABOLIC PANEL WITH GFR
ALT: 30 U/L (ref 0–44)
AST: 24 U/L (ref 15–41)
Albumin: 4.3 g/dL (ref 3.5–5.0)
Alkaline Phosphatase: 84 U/L (ref 38–126)
Anion gap: 9 (ref 5–15)
BUN: 14 mg/dL (ref 6–20)
CO2: 26 mmol/L (ref 22–32)
Calcium: 9.1 mg/dL (ref 8.9–10.3)
Chloride: 103 mmol/L (ref 98–111)
Creatinine, Ser: 1.05 mg/dL (ref 0.61–1.24)
GFR, Estimated: >60 mL/min (ref >60)
Glucose, Bld: 90 mg/dL (ref 70–99)
Potassium: 4.4 mmol/L (ref 3.5–5.1)
Sodium: 138 mmol/L (ref 135–145)
Total Bilirubin: 0.4 mg/dL (ref 0.0–1.2)
Total Protein: 7.3 g/dL (ref 6.5–8.1)

## 2024-01-29 LAB — URINALYSIS, ROUTINE W REFLEX MICROSCOPIC
Bilirubin Urine: NEGATIVE
Glucose, UA: NEGATIVE mg/dL
Hgb urine dipstick: NEGATIVE
Ketones, ur: NEGATIVE mg/dL
Leukocytes,Ua: NEGATIVE
Nitrite: NEGATIVE
Protein, ur: NEGATIVE mg/dL
Specific Gravity, Urine: 1.013 (ref 1.005–1.030)
pH: 6 (ref 5.0–8.0)

## 2024-01-29 LAB — URINE DRUG SCREEN
Amphetamines: NOT DETECTED
Barbiturates: NOT DETECTED
Benzodiazepines: NOT DETECTED
Cocaine: NOT DETECTED
Fentanyl: NOT DETECTED
Methadone Scn, Ur: NOT DETECTED
Opiates: NOT DETECTED
Tetrahydrocannabinol: NOT DETECTED

## 2024-01-29 LAB — AMMONIA: Ammonia: 39 umol/L — ABNORMAL HIGH (ref 9–35)

## 2024-01-29 LAB — CBG MONITORING, ED: Glucose-Capillary: 102 mg/dL — ABNORMAL HIGH (ref 70–99)

## 2024-01-29 LAB — ETHANOL: Alcohol, Ethyl (B): <15 mg/dL (ref <15)

## 2024-01-29 MED ORDER — LACTULOSE 20 GM/30ML PO SOLN: 10.0000 g | Freq: Three times a day (TID) | ORAL | 2 refills | Status: DC

## 2024-01-29 MED ORDER — METOCLOPRAMIDE HCL 10 MG PO TABS
10.0000 mg | ORAL_TABLET | Freq: Four times a day (QID) | ORAL | 0 refills | Status: DC
Start: 2024-01-29 — End: 2024-02-24

## 2024-01-29 MED ORDER — METOCLOPRAMIDE HCL 10 MG PO TABS: 10.0000 mg | ORAL_TABLET | Freq: Four times a day (QID) | ORAL | 0 refills | Status: DC

## 2024-01-29 MED ORDER — AMMONIA AROMATIC IN INHA
1.0000 | Freq: Once | RESPIRATORY_TRACT | Status: DC
  Filled 2024-01-29: qty 10

## 2024-01-29 MED ORDER — NALOXONE HCL 0.4 MG/ML IJ SOLN
0.4000 mg | Freq: Once | INTRAMUSCULAR | Status: AC
  Administered 2024-01-29: 0.4 mg via INTRAVENOUS
  Filled 2024-01-29: qty 1

## 2024-01-29 MED ORDER — ACETAMINOPHEN 500 MG PO TABS
1000.0000 mg | ORAL_TABLET | Freq: Once | ORAL | Status: DC
  Filled 2024-01-29: qty 2

## 2024-01-29 NOTE — ED Notes (Signed)
 Provider requested pt be ambulated... Pt ambulated... Pt able to ambulate without assistance... Pt appeared to still be wobbly while ambulating... Provider informed.SABRASABRA

## 2024-01-29 NOTE — ED Notes (Addendum)
 Patient ambulatory to wheelchair. Patient and family agreeable to discharge. Patient taken to car via wheelchair and assisted into car safely.

## 2024-01-29 NOTE — ED Provider Notes (Signed)
 [place] EMERGENCY DEPARTMENT AT [facility] Provider Note   CSN: [id number] Arrival date & time: 01/29/24  8365     Patient presents with: Altered Mental Status   Todd Roth is a 38 y.o. male.   38 year old male with a history of sleep apnea, TBI in 2022, and migraine headaches who presents to the emergency department with excessive sleepiness.  History obtained per the patient's wife.  Reports that he will occasionally have episodes where he is very drowsy during the day and has excessive sleepiness.  Says that this got worse yesterday and he has been very somnolent and difficult to arouse all day.  He asked her to just give him some modafinil to wake him up but she decided to bring him into the emergency department for evaluation.  Was hospitalized for this in 2024 for and thought to have encephalopathy either from sleep apnea or from psychiatric causes.  He did have imaging as well as an EEG at that time that was reassuring.  His ammonia  was slightly elevated.  She denies any alcohol use or drug use.  Only takes pantoprazole and modafinil at this point in time.  Per his wife no alcohol or drug use       Prior to Admission medications   Medication Sig Start Date End Date Taking? Authorizing Provider  cyanocobalamin  1000 MCG tablet Take 1 tablet (1,000 mcg total) by mouth daily. 11/24/22   Patsy Lenis, MD  Lactulose  20 GM/30ML SOLN Take 15 mLs (10 g total) by mouth in the morning, at noon, and at bedtime. 01/29/24   Yolande Lamar BROCKS, MD  metoCLOPramide  (REGLAN ) 10 MG tablet Take 1 tablet (10 mg total) by mouth every 6 (six) hours. 01/29/24   Yolande Lamar BROCKS, MD  Multiple Vitamin (MULTIVITAMIN WITH MINERALS) TABS tablet Take 1 tablet by mouth daily. 11/24/22   Patsy Lenis, MD  pantoprazole (PROTONIX) 20 MG tablet Take 20 mg by mouth 2 (two) times daily before a meal.    [provider]  pregabalin  (LYRICA ) 300 MG capsule Take 300 mg by mouth 2 (two)  times daily.    [provider]  propranolol (INDERAL) 40 MG tablet Take 20-40 mg by mouth 3 (three) times daily. 03/20/22   [provider]  SUMAtriptan  (IMITREX ) 100 MG tablet Take 100 mg by mouth every 2 (two) hours as needed for migraine. May repeat in 2 hours if headache persists or recurs.    [provider]  thiamine  (VITAMIN B1) 100 MG tablet Take 1 tablet (100 mg total) by mouth daily. 11/23/22   Patsy Lenis, MD    Allergies: Metronidazole    Review of Systems  Updated Vital Signs BP 126/80   Pulse 72   Temp 98.5 F (36.9 C) (Oral)   Resp 18   SpO2 96%   Physical Exam Vitals and nursing note reviewed.  Constitutional:      General: He is not in acute distress.    Appearance: He is well-developed.     Comments: Somnolent but arouses to loud voice and pain.  HENT:     Head: Normocephalic and atraumatic.     Right Ear: External ear normal.     Left Ear: External ear normal.     Nose: Nose normal.  Eyes:     Extraocular Movements: Extraocular movements intact.     Conjunctiva/sclera: Conjunctivae normal.     Pupils: Pupils are equal, round, and reactive to light.     Comments: Pupils 6  mm bilaterally.  Patient actively resists opening eyes.  Cardiovascular:     Rate and Rhythm: Normal rate and regular rhythm.     Heart sounds: Normal heart sounds.  Pulmonary:     Effort: Pulmonary effort is normal. No respiratory distress.     Breath sounds: Normal breath sounds.  Musculoskeletal:     Cervical back: Normal range of motion and neck supple.     Right lower leg: No edema.     Left lower leg: No edema.  Skin:    General: Skin is warm and dry.  Neurological:     Mental Status: Mental status is at baseline.     Comments: Cranial nerves II through XII appear grossly intact.  Moves all 4 extremities on command.  Will not cooperate with neuroexam otherwise.  Psychiatric:        Mood and Affect: Mood normal.        Behavior: Behavior normal.      (all labs ordered are listed, but only abnormal results are displayed) Labs Reviewed  URINALYSIS, ROUTINE W REFLEX MICROSCOPIC - Abnormal; Notable for the following components:      Result Value   Color, Urine COLORLESS (*)    All other components within normal limits  AMMONIA  - Abnormal; Notable for the following components:   Ammonia  39 (*)    All other components within normal limits  CBG MONITORING, ED - Abnormal; Notable for the following components:   Glucose-Capillary 102 (*)    All other components within normal limits  COMPREHENSIVE METABOLIC PANEL WITH GFR  ETHANOL  CBC WITH DIFFERENTIAL/PLATELET  URINE DRUG SCREEN    EKG: None  Radiology: CT Head Wo Contrast Result Date: 01/29/2024 CLINICAL DATA:  ams EXAM: CT HEAD WITHOUT CONTRAST TECHNIQUE: Contiguous axial images were obtained from the base of the skull through the vertex without intravenous contrast. RADIATION DOSE REDUCTION: This exam was performed according to the departmental dose-optimization program which includes automated exposure control, adjustment of the mA and/or kV according to patient size and/or use of iterative reconstruction technique. COMPARISON:  CT head 02/04/2023. FINDINGS: Brain: No evidence of large-territorial acute infarction. No parenchymal hemorrhage. No mass lesion. No extra-axial collection. No mass effect or midline shift. No hydrocephalus. Basilar cisterns are patent. Vascular: No hyperdense vessel. Skull: No acute fracture or focal lesion. Sinuses/Orbits: Right frontal sinus osteoma. Paranasal sinuses and mastoid air cells are clear. The orbits are unremarkable. Other: None. IMPRESSION: No acute intracranial abnormality. Electronically Signed   By: Morgane  Naveau M.D.   On: 01/29/2024 18:07     Procedures   Medications Ordered in the ED  naloxone  (NARCAN ) injection 0.4 mg (0.4 mg Intravenous Given 01/29/24 1742)                                    Medical Decision Making Amount  and/or Complexity of Data Reviewed Labs: ordered. Radiology: ordered.  Risk OTC drugs. Prescription drug management.   38 year old male with a history of sleep apnea, TBI in 2022, and migraine headaches who presents to the emergency department with excessive sleepiness.  Initial Ddx:  Excessive daytime sleepiness, sleep apnea, insomnia, narcolepsy, substance use, alcohol intoxication, stroke, ICH, meningitis, encephalitis, hepatic cephalopathy  MDM/Course:  Patient presents to the emergency department with his wife for excessive sleepiness today.  Has been told he has sleep apnea but appears that he is not on a CPAP machine.  Has come  into the emergency department for this previously with reassuring workups.  Was admitted to the hospital on 11/19/2018 for and had an extensive workup that included an EEG and overnight monitoring.  It was thought to be either due to sleep apnea, psychiatric disease, or sedating medications at home.  His ammonia  was slightly elevated but responded well to lactulose .  On arrival is very drowsy but is arousable specially to painful stimuli and will talk to us .  Denies any drug use.  No focal neurologic deficits.  No neck stiffness.  No fever.  Wife reports that it is not uncommon for him to get like this at home.  He had a head CT as well as blood work which showed a very marginally elevated ammonia .  Do not feel this is likely the cause of his symptoms now.  Drug screen and alcohol level normal.  Patient started to wake up and was able to ambulate with minimal assistance.  Wife reports that he is like this often when he gets very sleepy.  As such do not see any benefit from admission at this point in time since he has already had extensive workup for this.  Will have him follow-up as an outpatient with his primary doctor to continue his workup.  This patient presents to the ED for concern of complaints listed in HPI, this involves an extensive number of treatment  options, and is a complaint that carries with it a high risk of complications and morbidity. Disposition including potential need for admission considered.   Dispo: DC Home. Return precautions discussed including, but not limited to, those listed in the AVS. Allowed pt time to ask questions which were answered fully prior to dc.  Additional history obtained from spouse Records reviewed ED Visit Notes, Admission Notes, and DC Summary The following labs were independently interpreted: Chemistry and show no acute abnormality I independently reviewed the following imaging with scope of interpretation limited to determining acute life threatening conditions related to emergency care: CT Head and agree with the radiologist interpretation with the following exceptions: none I personally reviewed and interpreted cardiac monitoring: normal sinus rhythm  I personally reviewed and interpreted the pt's EKG: see above for interpretation  I have reviewed the patients home medications and made adjustments as needed  Portions of this note were generated with Dragon dictation software. Dictation errors may occur despite best attempts at proofreading.     Final diagnoses:  Drowsiness  Nonintractable headache, unspecified chronicity pattern, unspecified headache type    ED Discharge Orders          Ordered    Lactulose  20 GM/30ML SOLN  3 times daily,   Status:  Discontinued        01/29/24 2059    metoCLOPramide  (REGLAN ) 10 MG tablet  Every 6 hours,   Status:  Discontinued        01/29/24 2100    Lactulose  20 GM/30ML SOLN  3 times daily        01/29/24 2101    metoCLOPramide  (REGLAN ) 10 MG tablet  Every 6 hours        01/29/24 2101               Yolande Lamar BROCKS, MD 01/31/24 1145

## 2024-01-29 NOTE — Discharge Instructions (Signed)
 You were seen for your headache in the emergency department.  You were given medicines which improved your symptoms.  At home, please take Tylenol  and ibuprofen for your headache.  You may also take the Reglan we have prescribed you for your headache or any nausea or vomiting that you have.  You may take this with Benadryl for severe headaches but please note that this will make you drowsy.    Check your MyChart online for the results of any tests that had not resulted by the time you left the emergency department.   Follow-up with your primary doctor in 2-3 days regarding your visit.  Follow-up with your neurologist.   Return immediately to the emergency department if you experience any of the following: Vision changes, numbness or weakness of your arms or legs, or any other concerning symptoms.    Thank you for visiting our Emergency Department. It was a pleasure taking care of you today.

## 2024-01-29 NOTE — ED Triage Notes (Signed)
 Patient to ED via POV with altered mental status. Patient responsive to painful stimuli but otherwise is not interacting with staff appropriately.   Patient has been sleeping for the past day per wife at bedside. Sleep issues reported intermittent for the past year to where he is prescribed medications to help him wake up  Hx of TBI in 2022 and recent headaches reported by wife.

## 2024-02-09 NOTE — ED Provider Notes (Signed)
 11:07 PM Assumed care of patient from off-going team. For more details, please see note from same day.  In brief, this is a 38 y.o. male with past medical history of a TBI in 2022 and chronic migraine, who presents with worsening migraine.  Patient normal migraines consist of right side throbbing, ocular involvement, fatigue that fluctuates.  Patient has been seen at other ERs but has had inconsistent outpatient follow-up.  Patient reported 2 weeks of more severe pain, changes of vision, flashes of light in vision, increased intensity of migraine.  Patient describes the feeling as a thrashing of the right side of his head.  Patient reports that he is having issues with ambulating.  On daytime team evaluation, patient noted to be going in and out of concentration with trailing conversations.  Chart review revealed a history of potential encephalopathy but ruled out today with normal ammonia .  Of note patient was recently started on Topamax 4 days ago.  Neuro recommends staying on current dose with incremental increases weekly.  Pending at time of sign out: []  Pending ophthalmology recommendations []  Pending neurology recommendations   Updates since sign out: Ophthalmology signed off reported benign exam with no recommendations on their part.  Neurology evaluated patient and determined patient likely has migraine with ocular and vestibular components. They recommended increasing Topamax to 75 mg  Migraine with ocular vestibular components. Patient was also advised to discontinue sumatriptan  and will be initiated on Maxalt (a dissolvable Triptan). Patient was also counseled on medication overuse headache. Patient discharged home with referral for vestibular PT, referral to general neurology clinic, and expedited follow up at [facility].   Patient stable for discharge home. Return precautions provided.     ED Course  ED Course as of 02/10/24 0733  Sun Feb 09, 2024  1949 7:49 PM CT Head: No findings to  explain headache. No intracranial abnormalities. [DK]  1956 Ammonia (!): 54 [DK]  2132 9:33 PM  Neurology prefers to stay the course of increase Topamax every week, ensure he has a triptan for rescue, and to f/u outpatient with them for more thorough workup. [DK]  2247 10:47 PM Paged optho for recs [DK]  2303 11:03 PM Per Ophtho: Exam was benign without evidence of any intraocular etiology which would explain his symptoms [DK]  2326 Neuro: Still pending to see the patient. Had several code strokes. Neuro still says they will see patient. [RT]  Mon Feb 10, 2024  0254 Neuro: Still pending to see the patient. More code strokes have been called. Will see the patient when able. [RT]    ED Course User Index [DK] Elpidio Nancyann Mt, DO [RT] Tuli, Reena Laila, MD    Medications Administered in the Emergency Department   lactated ringers  bolus 1,000 mL (0 mLs Intravenous Stopped 02/09/24 2111) prochlorperazine  (COMPAZINE ) injection 5 mg (5 mg Intravenous Given 02/09/24 1736) ketorolac  (TORADOL ) injection 15 mg (15 mg Intravenous Given 02/09/24 1736) diphenhydrAMINE (BENADRYL) injection 25 mg (25 mg Intravenous Given 02/09/24 1735) magnesium sulfate 2 g/50 mL IVPB (0 g Intravenous Stopped 02/09/24 2111)   ED Clinical Impression  1. Intractable headache, unspecified chronicity pattern, unspecified headache type      ED Disposition  Discharge   Discharge Medications    Medication List    START taking these medications   . rizatriptan 5 MG disintegrating tablet; Commonly known as: MAXALT-MLT;  Take 1 tablet (5 mg total) by mouth as directed for Migraine May take a  second dose after 2 hours if needed.  Do not take more than 30 mg daily.       Referrals  Procedures  . Ambulatory Referral to Neurology  . Ambulatory Referral to Physical Therapy        [name], MD Resident 02/10/24 [phone]

## 2024-02-10 NOTE — Consults (Signed)
 Todd Roth  Neurology Consultation Note   Requesting Attending: Marylin Hussar, MD,Emergency Medicine Reason for Consultation: headache  Date of Service: 02/10/24  History of Present Illness:   [name] is a 38 y.o. Male with PMHx TBI, headaches, schizoaffective disorder, color blindness, mild OSA who presents to the ED with headache. Neurology is consulted for medication recommendations for migraine.  Patient follows in [place] neuro TBI clinic with Dr. Jaycee. He presents with intractable migraine for the last month with visual symptoms of shifting images. 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 Went to [facility] 03/2021 and got CTH imaging that showed large frontal scalp hematoma and laceration but no acute intracranial abnormality. He then got a brain MRI brain with no acute findings. Had home health care after he was discharged for 2-3 months until he was able to walk again - was having difficulty with this due to the head pain being so bad, dizziness, left-sided weakness and numbness due to multiple nerve injuries in his left arm and foot.  Location of headache pain is right half of his head. Pain is described as combined constant tightness and added intensity/throbbing that fades. Used to get better with rest in a dark room but not recently. Triggers for headache include trying to do work or use his phone. Associated symptoms are photophobia, dizziness, nausea when piercing pain is there. Patient sees things in his vision jump from side to side and dance in circles before re-stabilizing. Patient has transient numbness/tingling in both arms and legs when he feels surreal and gets startled easily by any stimulus like he was daydreaming before. Headache does not change  with position  -Abortive therapies tried: sumatriptan  50 mg - has been taking this as needed 1.5 years and has been taking it daily for the last 2 weeks at the max dose per day, tylenol , ibuprofen every 6-8 hours for the last week -Preventative therapies tried: propranolol (not helpful), amitriptyline  (not helpful), nortriptyline (not helpful), pregabalin  (took for L ankle pain), currently taking topamax upramp to 100 mg qd eventually (on 50 mg qd now) -Other therapies: occipital nerve block one week ago at the [place] - this was helpful for the sharp piercing pain (but not for constant R sided head pain)  His severe headaches used to have varying frequency but as of about a month ago have been daily, especially over the last two weeks. Current HA has been ongoing for 5 weeks straight, got extra symptomatic 2 weeks ago. Constant right-sided pain for this time every day. This frequency has been the case for a while now, but usually it's not constant throughout the whole day or with bad light sensitivity. Current pain level is 6/10. For this reason he recently started topamax for preventative therapy. So far he is tolerating the topamax well, he says it wakes him up when he takes it at night but he can't fall asleep for several hours. This was not happening before so he has started taking it in the mornings.  Last April he had excessive daytime somnolence even after sleeping 10 hours per night. His sleep study was borderline, he was meant to get a longer one but this never happened  Denies fever/chills  Denies changes in gait or falls Denies focal weakness  Denies tinnitus  Denies loss of vison    Review of Systems:   10-point ROS negative except as per HPI.  Past Medical History:   No past medical history on file.  Past Surgical History:   No past surgical history on file.  Family History:   No family history on file.   Social History:   Social History   Socioeconomic History  .  Marital status: Married   Social Drivers of Health   Food Insecurity: No Food Insecurity (11/20/2022)   Received from [facility]   Hunger Vital Sign   . Within the past 12 months, you worried that your food would run out before you got the money to buy more.: Never true   . Within the past 12 months, the food you bought just didn't last and you didn't have money to get more.: Never true  Transportation Needs: No Transportation Needs (11/20/2022)   Received from [facility] - Transportation   . Lack of Transportation (Medical): No   . Lack of Transportation (Non-Medical): No     Medications:   None    Allergies:   Allergies  Allergen Reactions  . Codeine Other (See Comments)  . Metronidazole Abdominal Pain and Diarrhea    C.diff    Physical Exam:   Vital signs in last 24 hours: Temp:  [36.5 C (97.7 F)-36.7 C (98.1 F)] 36.5 C (97.7 F) Heart Rate:  [68-82] 68 Resp:  [16-18] 16 BP: (111-178)/(71-84) 111/75 Temp (24hrs), Avg:36.6 C (97.9 F), Min:36.5 C (97.7 F), Max:36.7 C (98.1 F)  SpO2: 96 %    General: In no acute distress. HEENT: Anicteric sclerae. OP clear and moist. Pulmonary:  Normal work of breathing on room air Abdomen:  Soft. Non-distended. Extremity:  Warm and well perfused.   Neurologic Exam: Mental Status: Awake. Alert. Oriented to person, place, and time. Speech: No aphasia. No dysarthria. Naming and repetition intact. Cranial Nerves:    II,III: Pupils dilated following ophtho exam. VFF by confrontation.   III,IV,VI: EOMI w/o nystagmus. No ptosis   V: Sensation intact to light touch   VII: Face symmetric without weakness   VIII: Hearing to voice intact   IX,X: Voice normal, palate elevates symmetrically   XI: SCM/trap full   XII: Tongue protrudes midline. No atrophy or fasciculation Motor: Normal bulk and tone. No arm drift. No tremors.    Strength: Dlt Bic Tri Grp HF KF KE PF DF    Left 5 5 5 5 5 5 5 5 5     Right 5 5 5 5 5  5 5 5 5   Sensation: Decreased to pinprick in LUE and LLE (65%, chronic per patient) Coordination: Intact FTN and HTS bilaterally. Reflexes: 2+ throughout, symmetric. Gait: Normal gait, stride, and turn. No truncal ataxia. Patient intermittently stops while he is walking due to visual disturbances of things moving around him  [employer] without torsional nystagmus but worsened dizziness afterwards  Data:   Pertinent Labs: Recent Labs  Lab 02/09/24 1911  WBC 7.5  HGB 13.3*  HCT 38.4*  PLT 192   Recent Labs  Lab 02/09/24 1911  NA 136  K 3.5  CL 108  CO2 22  BUN 16  CREATININE 1.1  CALCIUM 8.2*  ALKPHOS 53  ALT 25  AST 30  GLUCOSE 119   No results for input(s): DRHAPTT, APTT, INR, PTT in the last 168 hours.   Relevant Imaging/Procedures:  MRI brain without contrast ([place] 10/2022) No acute intracranial abnormality  CTH without contrast 02/09/24 No acute intracranial abnormality  Assessment and Plan:   [name] is a 38 y.o. Male with PMHx TBI, headaches, schizoaffective disorder, color blindness, mild OSA who presents to the ED with headache. Neurology is consulted for medication recommendations for migraine.  Patient with history of TBI and subsequent migraines presents with ongoing migraines with increased severity for the last month and features of ocular distortions and dysequilibrium. His neurologic exam reveals no focal deficits aside from chronic left arm and leg numbness. Reassuringly has normal EOMs without nystagmus, no truncal ataxia to suggest central cause of dizziness. Trenda Craze without torsional nystagmus though patient symptomatically worsened afterwards. CTH today with no acute findings and patient has received MRI brain in the outpatient setting for migraine workup that also showed no acute findings.   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 He is improving on topamax so recommend continuing this. We discussed that vestibular PT is helpful, along with better migraine control, for the dysequilibrium symptoms he is having. He is interested in follow up with [facility] for headache management so we will place referral. Will also change his triptan medication to one that will hopefully provide quicker relief of pain given lack of benefit with sumatriptan  recently.  RECOMMENDATIONS:  #Migraine with ocular and vestibular features -Please place ambulatory referral to general neurology -Outpatient PT referral (specify vestibular PT) -Continue topamax up ramp; okay to increase to 75 mg qd as of today  -Increase to 100 mg qd after 5 days -Discontinue sumatriptan  for abortive therapy -Start rizatriptan 10 mg prn for rescue therapy  # Disposition: per ED team; okay to discharge home from neurologic standpoint   CNS imaging personally reviewed. This consultation was discussed over the phone with Dr. Maree who agrees with the above. Recommendations were conveyed to primary team.     Please Page [phone] with any further questions   Tinnie Pate, DO PGY-3, Neurology   Discharge Follow-up Clinic Referral Ticket   What is the specific follow-up diagnosis or limited differential? Migraine with vestibular and ocular component  What is the specific follow-up indication?  Check on vestibular and ocular symptoms of migraine. Patient was given a referral for vestibular PT  What workup has been completed for this diagnosis? Clinical: neuro exam, ophtho exam Imaging: CTH in ED, prior brain MRI outpatient Laboratory: none Studies: N/A  Please paste the last neurological exam for this patient: Neurologic Exam: Mental Status: Awake. Alert. Oriented to person, place, and time. Speech: No aphasia. No dysarthria. Naming and  repetition intact. Cranial Nerves:    II,III: Pupils dilated following ophtho exam. VFF by confrontation.   III,IV,VI: EOMI w/o nystagmus. No ptosis   V: Sensation intact to light touch   VII: Face symmetric without weakness   VIII: Hearing to voice intact   IX,X: Voice normal, palate elevates symmetrically   XI: SCM/trap full   XII: Tongue protrudes midline. No atrophy or fasciculation Motor: Normal bulk and tone. No arm drift. No tremors.    Strength: Dlt Bic Tri Grp HF KF KE PF DF    Left 5 5 5 5 5 5 5 5 5     Right 5 5 5 5 5 5 5 5 5   Sensation: Decreased to pinprick in LUE and LLE (65%, chronic per patient) Coordination: Intact FTN and HTS bilaterally. Reflexes: 2+ throughout, symmetric.  Gait: Normal gait, stride, and turn. No truncal ataxia. Patient intermittently stops while he is walking due to visual disturbances of things moving around him  [employer] without torsional nystagmus but worsened dizziness afterwards  Please list the relevant neurological medications and doses for this follow-up: Topamax up ramp; was taking 50 mg, recommended increase to 75 then 100 mg qd over several days  Anticipatory Guidance: What tests need to be followed up? none What will remain to be worked up or checked on? Vestibular and ocular sx of migraine Are there any interventions that remain to be done?  no Anticipatory guidance for predictable abnormalities on follow-up? If patient not responding well to rizatriptan can try nurtec or [place]          ------------------------------------------------------------------------------- Attestation signed by Maree Bussing, MD at 02/12/2024  4:43 PM Attestation Statement:   This service was rendered under my overall direction and control, and I was immediately available via phone/pager or present on site.  BUSSING MAREE, MD  -------------------------------------------------------------------------------

## 2024-02-10 NOTE — ED Notes (Signed)
 Discharge instructions reviewed with patient. Pt verbalized understanding, all questions answered by this RN. Discharge paperwork given to patient prior to discharge. Pt airway intact, RR even and unlabored upon discharge. Pt ambulatory to waiting room with steady gait. All belongings left with patient.

## 2024-02-23 ENCOUNTER — Emergency Department (HOSPITAL_COMMUNITY)

## 2024-02-23 ENCOUNTER — Other Ambulatory Visit: Payer: Self-pay

## 2024-02-23 ENCOUNTER — Emergency Department (HOSPITAL_COMMUNITY)
Admission: EM | Admit: 2024-02-23 | Discharge: 2024-02-24 | Disposition: A | Discharge status: Home / Self Care | Attending: Emergency Medicine | Admitting: Emergency Medicine

## 2024-02-23 DIAGNOSIS — G43109 Migraine with aura, not intractable, without status migrainosus: Secondary | ICD-10-CM | POA: Diagnosis not present

## 2024-02-23 DIAGNOSIS — R519 Headache, unspecified: Secondary | ICD-10-CM | POA: Diagnosis present

## 2024-02-23 DIAGNOSIS — F449 Dissociative and conversion disorder, unspecified: Secondary | ICD-10-CM

## 2024-02-23 LAB — CBC
HCT: 41.9 % (ref 39.0–52.0)
Hemoglobin: 14.9 g/dL (ref 13.0–17.0)
MCH: 32.2 pg (ref 26.0–34.0)
MCHC: 35.6 g/dL (ref 30.0–36.0)
MCV: 90.5 fL (ref 80.0–100.0)
Platelets: 188 K/uL (ref 150–400)
RBC: 4.63 MIL/uL (ref 4.22–5.81)
RDW: 12.4 % (ref 11.5–15.5)
WBC: 7.3 K/uL (ref 4.0–10.5)
nRBC: 0 % (ref 0.0–0.2)

## 2024-02-23 LAB — COMPREHENSIVE METABOLIC PANEL WITH GFR
ALT: 33 U/L (ref 0–44)
AST: 27 U/L (ref 15–41)
Albumin: 3.9 g/dL (ref 3.5–5.0)
Alkaline Phosphatase: 67 U/L (ref 38–126)
Anion gap: 11 (ref 5–15)
BUN: 13 mg/dL (ref 6–20)
CO2: 19 mmol/L — ABNORMAL LOW (ref 22–32)
Calcium: 8.6 mg/dL — ABNORMAL LOW (ref 8.9–10.3)
Chloride: 106 mmol/L (ref 98–111)
Creatinine, Ser: 1.17 mg/dL (ref 0.61–1.24)
GFR, Estimated: >60 mL/min (ref >60)
Glucose, Bld: 182 mg/dL — ABNORMAL HIGH (ref 70–99)
Potassium: 3.4 mmol/L — ABNORMAL LOW (ref 3.5–5.1)
Sodium: 136 mmol/L (ref 135–145)
Total Bilirubin: 0.9 mg/dL (ref 0.0–1.2)
Total Protein: 6.7 g/dL (ref 6.5–8.1)

## 2024-02-23 LAB — I-STAT CHEM 8, ED
BUN: 15 mg/dL (ref 6–20)
Calcium, Ion: 1.14 mmol/L — ABNORMAL LOW (ref 1.15–1.40)
Chloride: 106 mmol/L (ref 98–111)
Creatinine, Ser: 1.2 mg/dL (ref 0.61–1.24)
Glucose, Bld: 185 mg/dL — ABNORMAL HIGH (ref 70–99)
HCT: 42 % (ref 39.0–52.0)
Hemoglobin: 14.3 g/dL (ref 13.0–17.0)
Potassium: 3.3 mmol/L — ABNORMAL LOW (ref 3.5–5.1)
Sodium: 138 mmol/L (ref 135–145)
TCO2: 19 mmol/L — ABNORMAL LOW (ref 22–32)

## 2024-02-23 LAB — DIFFERENTIAL
Abs Immature Granulocytes: 0.01 K/uL (ref 0.00–0.07)
Basophils Absolute: 0 K/uL (ref 0.0–0.1)
Basophils Relative: 0 %
Eosinophils Absolute: 0.1 K/uL (ref 0.0–0.5)
Eosinophils Relative: 2 %
Immature Granulocytes: 0 %
Lymphocytes Relative: 26 %
Lymphs Abs: 1.9 K/uL (ref 0.7–4.0)
Monocytes Absolute: 0.5 K/uL (ref 0.1–1.0)
Monocytes Relative: 7 %
Neutro Abs: 4.8 K/uL (ref 1.7–7.7)
Neutrophils Relative %: 65 %

## 2024-02-23 LAB — PROTIME-INR
INR: 1.1 (ref 0.8–1.2)
Prothrombin Time: 14.7 s (ref 11.4–15.2)

## 2024-02-23 LAB — ACETAMINOPHEN LEVEL: Acetaminophen (Tylenol), Serum: <10 ug/mL — ABNORMAL LOW (ref 10–30)

## 2024-02-23 LAB — ETHANOL: Alcohol, Ethyl (B): <15 mg/dL (ref <15)

## 2024-02-23 LAB — APTT: aPTT: 30 s (ref 24–36)

## 2024-02-23 LAB — CBG MONITORING, ED: Glucose-Capillary: 186 mg/dL — ABNORMAL HIGH (ref 70–99)

## 2024-02-23 LAB — SALICYLATE LEVEL: Salicylate Lvl: <7 mg/dL — ABNORMAL LOW (ref 7.0–30.0)

## 2024-02-23 MED ORDER — PROCHLORPERAZINE EDISYLATE 10 MG/2ML IJ SOLN
10.0000 mg | Freq: Once | INTRAMUSCULAR | Status: AC
  Administered 2024-02-23: 10 mg via INTRAVENOUS
  Filled 2024-02-23: qty 2

## 2024-02-23 MED ORDER — LACTATED RINGERS IV BOLUS
1000.0000 mL | Freq: Once | INTRAVENOUS | Status: AC
  Administered 2024-02-23: 1000 mL via INTRAVENOUS

## 2024-02-23 MED ORDER — ONDANSETRON HCL 4 MG/2ML IJ SOLN: 4.0000 mg | Freq: Once | INTRAMUSCULAR | Status: DC

## 2024-02-23 MED ORDER — IOHEXOL 350 MG/ML SOLN
75.0000 mL | Freq: Once | INTRAVENOUS | Status: AC | PRN
  Administered 2024-02-23: 75 mL via INTRAVENOUS

## 2024-02-23 MED ORDER — SODIUM CHLORIDE 0.9% FLUSH
3.0000 mL | Freq: Once | INTRAVENOUS | Status: AC
  Administered 2024-02-23: 3 mL via INTRAVENOUS

## 2024-02-23 NOTE — ED Notes (Signed)
Transported to Ct scan

## 2024-02-23 NOTE — ED Triage Notes (Signed)
 Patient arrived with EMS from home as a code stroke activation , LSN 10 am this morning , family reports migraine headaches for 1 week , left side weakness and left facial droop this evening . Received 1 ampule D10% by EMS prior to arrival for CBG=68.

## 2024-02-23 NOTE — Code Documentation (Signed)
 Stroke Response Nurse Documentation Code Documentation  Todd Roth is a 38 y.o. male arriving to [facility]  via [place] [facility] on 7/27 with past medical hx of TBI and migraines, seizures. On No antithrombotic. Code stroke was activated by EMS.   Patient from home where he was LKW at 1000 and now complaining of left sided weakness.  Stroke team at the bedside on patient arrival. Labs drawn and patient cleared for CT by Dr. Jerrol. Patient to CT with team. NIHSS 13, see documentation for details and code stroke times. Patient with decreased LOC, disoriented, right gaze preference , left facial droop, left arm weakness, left leg weakness, left decreased sensation, Expressive aphasia , dysarthria , and left neglect on exam. The following imaging was completed:  CT Head, CTA, and MRI. Patient is not a candidate for IV Thrombolytic due to outside of window. Patient is not a candidate for IR due to No LVO.    Bedside handoff with ED RN Beryl.    Griselda Alm ORN  Rapid Response RN

## 2024-02-23 NOTE — ED Provider Notes (Signed)
 [facility]PT [facility] Emergency Department Provider Note MRN:  [id number]  Arrival date & time: 02/24/24     Chief Complaint   Code Stroke : LSN 10am  (Left Side Weakness)   History of Present Illness   Todd Roth is a 38 y.o. year-old male presents to the ED with chief complaint of headache.  States that he has been having frequent headaches and when his headaches get really severe, he stops behaving normally.  Has headaches all day everyday since June. States that he has been out of it all day today.   Family states that he has had troubles similar to tonight in the past, but this is the worst they've seen.  States he hasn't been eating and drinking normally in the past few days.  Has been under an increased amount of stress.    Recent increase in Topamax.  Takes Sumatriptan .  Hx of TBI and migraines. .  Has follow-up with [facility] on Thursday. History provided by patient.   Review of Systems  Pertinent positive and negative review of systems noted in HPI.    Physical Exam   Vitals:   02/24/24 0342 02/24/24 0345  BP:  100/69  Pulse:  60  Resp:  11  Temp: 98.4 F (36.9 C)   SpO2:  95%    CONSTITUTIONAL:  non toxic-appearing, NAD NEURO:  Alert and oriented x 3, CN 3-12 grossly intact, answers questions slowly, but appropriately EYES:  eyes equal and reactive ENT/NECK:  Supple, no stridor  CARDIO:  normal rate, regular rhythm, appears well-perfused  PULM:  No respiratory distress, CTAB GI/GU:  non-distended,  MSK/SPINE:  No gross deformities, no edema, moves all extremities well when distracted,   SKIN:  no rash, atraumatic   *Additional and/or pertinent findings included in MDM below  Diagnostic and Interventional Summary    EKG Interpretation Date/Time:  Sunday February 23 2024 23:24:42 EDT Ventricular Rate:  68 PR Interval:  194 QRS Duration:  88 QT Interval:  394 QTC Calculation: 419 R Axis:   -9  Text Interpretation: Sinus  rhythm Left ventricular hypertrophy ST elev, probable normal early repol pattern Atrial premature complexes 02/04/2023, No previous ECGs available Confirmed by Raford Lenis ([id number]) on 02/23/2024 11:29:19 PM       Labs Reviewed  COMPREHENSIVE METABOLIC PANEL WITH GFR - Abnormal; Notable for the following components:      Result Value   Potassium 3.4 (*)    CO2 19 (*)    Glucose, Bld 182 (*)    Calcium 8.6 (*)    All other components within normal limits  SALICYLATE LEVEL - Abnormal; Notable for the following components:   Salicylate Lvl <7.0 (*)    All other components within normal limits  ACETAMINOPHEN  LEVEL - Abnormal; Notable for the following components:   Acetaminophen  (Tylenol ), Serum <10 (*)    All other components within normal limits  I-STAT CHEM 8, ED - Abnormal; Notable for the following components:   Potassium 3.3 (*)    Glucose, Bld 185 (*)    Calcium, Ion 1.14 (*)    TCO2 19 (*)    All other components within normal limits  CBG MONITORING, ED - Abnormal; Notable for the following components:   Glucose-Capillary 186 (*)    All other components within normal limits  RESP PANEL BY RT-PCR (RSV, FLU A&B, COVID)  RVPGX2  PROTIME-INR  APTT  CBC  DIFFERENTIAL  ETHANOL  RAPID URINE DRUG SCREEN, HOSP PERFORMED  MR Brain W and Wo Contrast  Final Result    DG Chest Port 1 View  Final Result    CT ANGIO HEAD NECK W WO CM (CODE STROKE)  Final Result    CT VENOGRAM HEAD  Final Result    CT HEAD CODE STROKE WO CONTRAST  Final Result      Medications  sodium chloride  flush (NS) 0.9 % injection 3 mL (3 mLs Intravenous Given 02/23/24 2339)  iohexol  (OMNIPAQUE ) 350 MG/ML injection 75 mL (75 mLs Intravenous Contrast Given 02/23/24 2315)  lactated ringers  bolus 1,000 mL (0 mLs Intravenous Stopped 02/24/24 0009)  prochlorperazine  (COMPAZINE ) injection 10 mg (10 mg Intravenous Given 02/23/24 2337)  gadobutrol  (GADAVIST ) 1 MMOL/ML injection 10 mL (10 mLs Intravenous Contrast  Given 02/24/24 0258)     Procedures  /  Critical Care Procedures  ED Course and Medical Decision Making  I have reviewed the triage vital signs, the nursing notes, and pertinent available records from the EMR.  Social Determinants Affecting Complexity of Care: Patient has no clinically significant social determinants affecting this chief complaint..   ED Course:    Medical Decision Making Patient here with frequent headaches.  Now less responsive than normal.  Decreased oral intake.   Seen by and discussed with Dr. Michaela.  Recommends headache cocktail.  Headaches might be 2/2 frequent triptan use.  May benefit from newer abortive agents such as Nurtec, which would need to be prescribed by his neurologist.  Dr. Michaela recommends MRI.  Patient has reassuring imaging.  CT head, CTA, CT venogram, MRI with and without are all reassuring.  Patient is resting comfortably.  Seems to have had some relief with the Compazine .  I have gone over test results with family.  Plan for discharge home with outpatient neurology follow-up as per neurology's recommendations.  Amount and/or Complexity of Data Reviewed Labs: ordered. Radiology: ordered.  Risk Prescription drug management.         Consultants: I consulted with Dr. Michaela, who recommends outpatient follow-up with neurology if imaging is reassuring.   Treatment and Plan: I considered admission due to patient's initial presentation, but after considering the examination and diagnostic results, patient will not require admission and can be discharged with outpatient follow-up.    Final Clinical Impressions(s) / ED Diagnoses     ICD-10-CM   1. Frequent headaches  R51.9       ED Discharge Orders     None         Discharge Instructions Discussed with and Provided to Patient:     Discharge Instructions      Please follow-up with the neurologist at [facility].  Please asked them specifically about  Nurtec.  Do not take the sumatriptan  more than prescribed.  This can cause rebound headaches.  Your tests tonight look good.       Vicky Charleston, PA-C 02/24/24 9580    Raford Lenis, MD 02/24/24 [phone]

## 2024-02-24 ENCOUNTER — Emergency Department (HOSPITAL_COMMUNITY)

## 2024-02-24 LAB — RESP PANEL BY RT-PCR (RSV, FLU A&B, COVID)  RVPGX2
Influenza A by PCR: NEGATIVE
Influenza B by PCR: NEGATIVE
Resp Syncytial Virus by PCR: NEGATIVE
SARS Coronavirus 2 by RT PCR: NEGATIVE

## 2024-02-24 MED ORDER — GADOBUTROL 1 MMOL/ML IV SOLN
10.0000 mL | Freq: Once | INTRAVENOUS | Status: AC | PRN
  Administered 2024-02-24: 10 mL via INTRAVENOUS

## 2024-02-24 NOTE — Discharge Instructions (Addendum)
 Please follow-up with the neurologist at [facility].  Please asked them specifically about Nurtec.  Do not take the sumatriptan  more than prescribed.  This can cause rebound headaches.  Your tests tonight look good.

## 2024-02-24 NOTE — Consult Note (Signed)
 NEUROLOGY CONSULT NOTE   Date of service: February 24, 2024 Patient Name: Todd Roth MRN:  [id number] DOB:  Nov 25, 1985 Chief Complaint: left sided weakness Requesting Provider: Raford Lenis, MD  History of Present Illness  Todd Roth is a 38 y.o. 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  There is mention of left-sided weakness in the past, but with his sleep evaluation it was noted only as weakness with 5/5 strength.  He was recently increased from 75 mg/day to 100 mg/day of Topamax and his wife reports that he is not eating as much as he normally does.  Today, he was acting abnormally, more lethargic and not speaking as much.  This evening, his wife noticed some left-sided weakness and it was that which prompted her to call 911.  His LVO score was a four and therefore a code stroke was activated en route.  He has been worked up repeatedly for episodes of confusion/altered mental status.  He had LTM EEG in April of last year, as well as a sleep study performed as an outpatient.  He was most recently seen by neurology on July 14 at [facility] and has an upcoming appointment on Thursday.   LKW: 10 AM Modified rankin score: 2-Slight disability-UNABLE to perform all activities but does not need assistance IV Thrombolysis: No, outside of window EVT: No, no LVO   NIHSS components Score: Comment  1a Level of Conscious 0[]  1[]  2[x]  3[]      1b LOC Questions 0[]  1[]  2[x]       1c LOC Commands 0[x]  1[]  2[]       2 Best Gaze 0[]  1[x]  2[]       3 Visual 0[x]  1[]  2[]  3[]      4 Facial Palsy 0[]  1[x]  2[]  3[]      5a Motor Arm - left 0[]  1[]  2[x]  3[]  4[]  UN[]    5b Motor Arm - Right 0[x]  1[]  2[]  3[]  4[]  UN[]    6a Motor Leg - Left 0[]  1[]  2[x]  3[]  4[]  UN[]    6b Motor Leg - Right 0[x]  1[]  2[]  3[]  4[]  UN[]    7 Limb Ataxia 0[x]  1[]  2[]  UN[]      8 Sensory 0[x]  1[]  2[]  UN[]      9 Best Language 0[]  1[]  2[x]  3[]      10 Dysarthria 0[]  1[]   2[x]  UN[]      11 Extinct. and Inattention 0[x]  1[]  2[]       TOTAL: 14       Past History   Past Medical History:  Diagnosis Date   Esophageal reflux    Seizure disorder [facility])     Past Surgical History:  Procedure Laterality Date   eye surgery     REFRACTIVE SURGERY      Family History: No family history on file.  Social History  reports that he has never smoked. He has never used smokeless tobacco. He reports that he does not drink alcohol and does not use drugs.  Allergies  Allergen Reactions   Metronidazole Diarrhea    Medications  No current facility-administered medications for this encounter.  Current Outpatient Medications:    cyanocobalamin  1000 MCG tablet, Take 1 tablet (1,000 mcg total) by mouth daily., Disp: , Rfl:    Lactulose  20 GM/30ML SOLN, Take 15 mLs (10 g total) by mouth in the morning, at noon, and at bedtime., Disp: 450 mL, Rfl: 2   metoCLOPramide  (REGLAN ) 10 MG tablet, Take 1 tablet (10 mg total) by mouth  every 6 (six) hours., Disp: 15 tablet, Rfl: 0   Multiple Vitamin (MULTIVITAMIN WITH MINERALS) TABS tablet, Take 1 tablet by mouth daily., Disp: , Rfl:    pantoprazole (PROTONIX) 20 MG tablet, Take 20 mg by mouth 2 (two) times daily before a meal., Disp: , Rfl:    pregabalin  (LYRICA ) 300 MG capsule, Take 300 mg by mouth 2 (two) times daily., Disp: , Rfl:    propranolol (INDERAL) 40 MG tablet, Take 20-40 mg by mouth 3 (three) times daily., Disp: , Rfl:    SUMAtriptan  (IMITREX ) 100 MG tablet, Take 100 mg by mouth every 2 (two) hours as needed for migraine. May repeat in 2 hours if headache persists or recurs., Disp: , Rfl:    thiamine  (VITAMIN B1) 100 MG tablet, Take 1 tablet (100 mg total) by mouth daily., Disp: , Rfl:   Vitals   Vitals:   03/04/2024 2334 2024-03-04 2345 02/24/24 0000 02/24/24 0015  BP:  118/83 121/84 114/79  Pulse:  63 61 64  Resp:  13 15 16   Temp: (!) 97.5 F (36.4 C)     TempSrc: Temporal     SpO2:  97% 95% 95%  Weight:         Body mass index is 35.2 kg/m.   Physical Exam   Constitutional: Appears well-developed and well-nourished.  Neurologic Examination    Neuro: Mental Status: Patient initially does not open his eyes but does have Bell's phenomena when attempting to open them passively.  He eventually does arouse, and when questions are repeatedly posed, he will eventually answer them.  When asked the month, he states not sure and also declines to answer his age. Cranial Nerves: II: Blink to threat from both directions. Pupils are equal, round, and reactive to light.   III,IV, VI: He looks fully to the right, only crosses midline partially to the left V: Facial sensation is symmetric to temperature VII: He does not smile as much on the left as the right when asked to smile, however when asked to place air into his cheeks he holds the left taut Motor: He has a markedly inconsistent exam, when distracted he lifts his left side very well, but when asked to, he allows it to flop nearly flaccidly, this is true both the arm and leg Sensory: He response to stimulation bilaterally Cerebellar: No ataxia on finger-nose-finger on either side, of note no drift while performing with the left arm       Labs/Imaging/Neurodiagnostic studies   CBC:  Recent Labs  Lab 03/04/24 2253 2024-03-04 2257  WBC 7.3  --   NEUTROABS 4.8  --   HGB 14.9 14.3  HCT 41.9 42.0  MCV 90.5  --   PLT 188  --    Basic Metabolic Panel:  Lab Results  Component Value Date   NA 138 04-Mar-2024   K 3.3 (L) 04-Mar-2024   CO2 19 (L) 03/04/2024   GLUCOSE 185 (H) 2024-03-04   BUN 15 2024-03-04   CREATININE 1.20 03-04-24   CALCIUM 8.6 (L) 03/04/24   GFRNONAA >60 04-Mar-2024   GFRAA >60 10/04/2015   Lipid Panel: No results found for: LDLCALC HgbA1c: No results found for: HGBA1C Urine Drug Screen:     Component Value Date/Time   LABOPIA NONE DETECTED 01/29/2024 1956   COCAINSCRNUR NONE DETECTED 01/29/2024 1956    LABBENZ NONE DETECTED 01/29/2024 1956   AMPHETMU NONE DETECTED 01/29/2024 1956   THCU NONE DETECTED 01/29/2024 1956   LABBARB NONE DETECTED 01/29/2024 1956  Alcohol Level     Component Value Date/Time   [facility] <15 02/23/2024 2253   INR  Lab Results  Component Value Date   INR 1.1 02/23/2024   APTT  Lab Results  Component Value Date   APTT 30 02/23/2024    CT Head without contrast(Personally reviewed): Negative  CT angio Head and Neck with contrast(Personally reviewed): Negative  ASSESSMENT   Todd Roth is a 38 y.o. male with recurrent presentations of altered mental status who presents with left-sided weakness and decreased responsiveness.  He has multiple findings on exam concerning for nonorganic etiology, but I do think it would be reasonable to exclude possible real symptoms with embellishment.  It is also difficult to exclude some degree of complicated migraine, though my suspicion is that this is more likely to be conversion.  RECOMMENDATIONS  Compazine  for nausea/migraine MRI brain w/wo If MRI brain is negative, then I would not pursue any further workup at this time. ______________________________________________________________________    Bonney Aisha Seals, MD [facility]
